# Patient Record
Sex: Female | Born: 1955 | Race: Black or African American | Hispanic: No | Marital: Single | Smoking: Former smoker
Health system: Southern US, Community
[De-identification: ages and names within clinical notes are randomized; demographics above are authoritative.]

## PROBLEM LIST (undated history)

## (undated) DIAGNOSIS — E119 Type 2 diabetes mellitus without complications: Secondary | ICD-10-CM

## (undated) DIAGNOSIS — I10 Essential (primary) hypertension: Secondary | ICD-10-CM

## (undated) DIAGNOSIS — U071 COVID-19: Secondary | ICD-10-CM

## (undated) HISTORY — PX: TONSILLECTOMY: SUR1361

## (undated) HISTORY — PX: ABDOMINAL HYSTERECTOMY: SHX81

---

## 2005-12-17 ENCOUNTER — Ambulatory Visit: Payer: Self-pay | Admitting: Oncology

## 2005-12-23 LAB — MORPHOLOGY - CHCC SATELLITE
PLT EST ~~LOC~~: ADEQUATE
Platelet Morphology: NORMAL

## 2005-12-23 LAB — CBC WITH DIFFERENTIAL (CANCER CENTER ONLY)
BASO#: 0 10*3/uL (ref 0.0–0.2)
Eosinophils Absolute: 0.1 10*3/uL (ref 0.0–0.5)
HGB: 9.5 g/dL — ABNORMAL LOW (ref 11.6–15.9)
LYMPH#: 2.9 10*3/uL (ref 0.9–3.3)
MCH: 18.8 pg — ABNORMAL LOW (ref 26.0–34.0)
MONO#: 0.3 10*3/uL (ref 0.1–0.9)
MONO%: 4.5 % (ref 0.0–13.0)
NEUT#: 3.4 10*3/uL (ref 1.5–6.5)
RBC: 5.05 10*6/uL (ref 3.70–5.32)

## 2005-12-24 LAB — COMPREHENSIVE METABOLIC PANEL
ALT: 14 U/L (ref 0–40)
AST: 21 U/L (ref 0–37)
Alkaline Phosphatase: 133 U/L — ABNORMAL HIGH (ref 39–117)
BUN: 8 mg/dL (ref 6–23)
Calcium: 9.5 mg/dL (ref 8.4–10.5)
Chloride: 113 mEq/L — ABNORMAL HIGH (ref 96–112)
Creatinine, Ser: 0.8 mg/dL (ref 0.40–1.20)
Total Bilirubin: 0.7 mg/dL (ref 0.3–1.2)

## 2005-12-24 LAB — IRON AND TIBC
%SAT: 12 % — ABNORMAL LOW (ref 20–55)
TIBC: 424 ug/dL (ref 250–470)
UIBC: 373 ug/dL

## 2005-12-24 LAB — RETICULOCYTES (CHCC): ABS Retic: 87.9 10*3/uL (ref 19.0–186.0)

## 2005-12-24 LAB — FOLATE: Folate: 10.1 ng/mL

## 2006-01-28 ENCOUNTER — Ambulatory Visit: Payer: Self-pay | Admitting: Internal Medicine

## 2006-01-29 ENCOUNTER — Ambulatory Visit: Payer: Self-pay | Admitting: Oncology

## 2006-03-02 LAB — CBC WITH DIFFERENTIAL (CANCER CENTER ONLY)
BASO#: 0 10*3/uL (ref 0.0–0.2)
EOS%: 1.1 % (ref 0.0–7.0)
HGB: 10.7 g/dL — ABNORMAL LOW (ref 11.6–15.9)
LYMPH#: 3.3 10*3/uL (ref 0.9–3.3)
MCHC: 31 g/dL — ABNORMAL LOW (ref 32.0–36.0)
NEUT#: 4.3 10*3/uL (ref 1.5–6.5)
RBC: 5.4 10*6/uL — ABNORMAL HIGH (ref 3.70–5.32)

## 2006-03-02 LAB — IRON AND TIBC: Iron: 90 ug/dL (ref 42–145)

## 2006-04-05 ENCOUNTER — Ambulatory Visit: Payer: Self-pay | Admitting: Oncology

## 2006-04-16 LAB — CBC WITH DIFFERENTIAL (CANCER CENTER ONLY)
BASO#: 0 10*3/uL (ref 0.0–0.2)
Eosinophils Absolute: 0.1 10*3/uL (ref 0.0–0.5)
HCT: 35.3 % (ref 34.8–46.6)
HGB: 11.3 g/dL — ABNORMAL LOW (ref 11.6–15.9)
LYMPH%: 44.9 % (ref 14.0–48.0)
MCH: 20.9 pg — ABNORMAL LOW (ref 26.0–34.0)
MCV: 65 fL — ABNORMAL LOW (ref 81–101)
MONO%: 5.7 % (ref 0.0–13.0)
NEUT%: 47.6 % (ref 39.6–80.0)
RBC: 5.43 10*6/uL — ABNORMAL HIGH (ref 3.70–5.32)

## 2006-04-20 LAB — IRON AND TIBC
%SAT: 34 % (ref 20–55)
TIBC: 351 ug/dL (ref 250–470)

## 2006-04-20 LAB — HEMOGLOBINOPATHY EVALUATION
Hgb A2 Quant: 5 % — ABNORMAL HIGH (ref 2.2–3.2)
Hgb F Quant: 0.4 % (ref 0.0–0.5)

## 2006-06-08 ENCOUNTER — Ambulatory Visit: Payer: Self-pay | Admitting: Oncology

## 2006-07-06 ENCOUNTER — Ambulatory Visit: Payer: Self-pay | Admitting: Internal Medicine

## 2006-07-08 LAB — CBC WITH DIFFERENTIAL (CANCER CENTER ONLY)
BASO%: 0.6 % (ref 0.0–2.0)
Eosinophils Absolute: 0.1 10*3/uL (ref 0.0–0.5)
HCT: 33.3 % — ABNORMAL LOW (ref 34.8–46.6)
LYMPH%: 49.3 % — ABNORMAL HIGH (ref 14.0–48.0)
MCH: 20.3 pg — ABNORMAL LOW (ref 26.0–34.0)
MCV: 64 fL — ABNORMAL LOW (ref 81–101)
MONO%: 3.6 % (ref 0.0–13.0)
NEUT%: 45.2 % (ref 39.6–80.0)
Platelets: 310 10*3/uL (ref 145–400)
RDW: 14.4 % (ref 10.5–14.6)

## 2006-07-08 LAB — IRON AND TIBC
Iron: 91 ug/dL (ref 42–145)
UIBC: 233 ug/dL

## 2006-07-08 LAB — FERRITIN: Ferritin: 146 ng/mL (ref 10–291)

## 2006-09-29 ENCOUNTER — Ambulatory Visit: Payer: Self-pay | Admitting: Oncology

## 2006-09-30 LAB — FERRITIN: Ferritin: 174 ng/mL (ref 10–291)

## 2006-09-30 LAB — CBC WITH DIFFERENTIAL (CANCER CENTER ONLY)
BASO%: 0.3 % (ref 0.0–2.0)
EOS%: 1.3 % (ref 0.0–7.0)
HCT: 30.5 % — ABNORMAL LOW (ref 34.8–46.6)
LYMPH#: 2.8 10*3/uL (ref 0.9–3.3)
MCHC: 31.7 g/dL — ABNORMAL LOW (ref 32.0–36.0)
MONO#: 0.3 10*3/uL (ref 0.1–0.9)
NEUT#: 3.2 10*3/uL (ref 1.5–6.5)
NEUT%: 50.6 % (ref 39.6–80.0)
Platelets: 275 10*3/uL (ref 145–400)
RDW: 14.4 % (ref 10.5–14.6)
WBC: 6.4 10*3/uL (ref 3.9–10.0)

## 2006-09-30 LAB — IRON AND TIBC: %SAT: 25 % (ref 20–55)

## 2006-10-05 LAB — SPEP & IFE WITH QIG
Beta 2: 5.1 % (ref 3.2–6.5)
Gamma Globulin: 15.9 % (ref 11.1–18.8)
IgA: 292 mg/dL (ref 68–378)
IgM, Serum: 171 mg/dL (ref 60–263)

## 2006-12-01 ENCOUNTER — Ambulatory Visit: Payer: Self-pay | Admitting: Oncology

## 2006-12-02 LAB — CBC WITH DIFFERENTIAL (CANCER CENTER ONLY)
BASO%: 0.5 % (ref 0.0–2.0)
EOS%: 1 % (ref 0.0–7.0)
LYMPH%: 45.5 % (ref 14.0–48.0)
MCHC: 31.7 g/dL — ABNORMAL LOW (ref 32.0–36.0)
MCV: 63 fL — ABNORMAL LOW (ref 81–101)
MONO#: 0.4 10*3/uL (ref 0.1–0.9)
Platelets: 276 10*3/uL (ref 145–400)
RDW: 15.3 % — ABNORMAL HIGH (ref 10.5–14.6)
WBC: 8.5 10*3/uL (ref 3.9–10.0)

## 2006-12-06 LAB — IRON AND TIBC
%SAT: 25 % (ref 20–55)
TIBC: 328 ug/dL (ref 250–470)

## 2006-12-06 LAB — HEMOGLOBINOPATHY EVALUATION
Hemoglobin Other: 0 % (ref 0.0–0.0)
Hgb A: 94.3 % — ABNORMAL LOW (ref 96.8–97.8)
Hgb S Quant: 0 % (ref 0.0–0.0)

## 2007-06-02 ENCOUNTER — Ambulatory Visit: Payer: Self-pay | Admitting: Obstetrics and Gynecology

## 2007-06-10 ENCOUNTER — Inpatient Hospital Stay: Payer: Self-pay | Admitting: Obstetrics and Gynecology

## 2007-12-25 ENCOUNTER — Emergency Department: Payer: Self-pay | Admitting: Emergency Medicine

## 2008-02-09 ENCOUNTER — Ambulatory Visit: Payer: Self-pay | Admitting: Internal Medicine

## 2009-06-03 ENCOUNTER — Emergency Department: Payer: Self-pay | Admitting: Emergency Medicine

## 2013-08-06 ENCOUNTER — Emergency Department: Payer: Self-pay | Admitting: Emergency Medicine

## 2013-08-10 ENCOUNTER — Emergency Department: Payer: Self-pay | Admitting: Emergency Medicine

## 2016-01-31 ENCOUNTER — Encounter: Payer: Self-pay | Admitting: Emergency Medicine

## 2016-01-31 ENCOUNTER — Emergency Department: Payer: Self-pay

## 2016-01-31 ENCOUNTER — Inpatient Hospital Stay
Admission: EM | Admit: 2016-01-31 | Discharge: 2016-02-04 | DRG: 419 | Disposition: A | Discharge status: Home / Self Care | Payer: Self-pay | Attending: General Surgery | Admitting: General Surgery

## 2016-01-31 DIAGNOSIS — Z90710 Acquired absence of both cervix and uterus: Secondary | ICD-10-CM

## 2016-01-31 DIAGNOSIS — K810 Acute cholecystitis: Principal | ICD-10-CM | POA: Diagnosis present

## 2016-01-31 DIAGNOSIS — I10 Essential (primary) hypertension: Secondary | ICD-10-CM | POA: Diagnosis present

## 2016-01-31 HISTORY — DX: Essential (primary) hypertension: I10

## 2016-01-31 LAB — CBC
HCT: 34.9 % — ABNORMAL LOW (ref 35.0–47.0)
Hemoglobin: 10.9 g/dL — ABNORMAL LOW (ref 12.0–16.0)
MCH: 19.8 pg — ABNORMAL LOW (ref 26.0–34.0)
MCHC: 31.3 g/dL — ABNORMAL LOW (ref 32.0–36.0)
MCV: 63.3 fL — AB (ref 80.0–100.0)
PLATELETS: 259 10*3/uL (ref 150–440)
RBC: 5.52 MIL/uL — AB (ref 3.80–5.20)
RDW: 15.5 % — ABNORMAL HIGH (ref 11.5–14.5)
WBC: 13.5 10*3/uL — AB (ref 3.6–11.0)

## 2016-01-31 LAB — COMPREHENSIVE METABOLIC PANEL
ALT: 14 U/L (ref 14–54)
AST: 26 U/L (ref 15–41)
Albumin: 4.7 g/dL (ref 3.5–5.0)
Alkaline Phosphatase: 102 U/L (ref 38–126)
Anion gap: 10 (ref 5–15)
BUN: 11 mg/dL (ref 6–20)
CHLORIDE: 103 mmol/L (ref 101–111)
CO2: 24 mmol/L (ref 22–32)
CREATININE: 0.74 mg/dL (ref 0.44–1.00)
Calcium: 9.5 mg/dL (ref 8.9–10.3)
Glucose, Bld: 162 mg/dL — ABNORMAL HIGH (ref 65–99)
POTASSIUM: 3.3 mmol/L — AB (ref 3.5–5.1)
Sodium: 137 mmol/L (ref 135–145)
Total Bilirubin: 1.7 mg/dL — ABNORMAL HIGH (ref 0.3–1.2)
Total Protein: 8.6 g/dL — ABNORMAL HIGH (ref 6.5–8.1)

## 2016-01-31 LAB — TROPONIN I: Troponin I: <0.03 ng/mL (ref <0.03)

## 2016-01-31 LAB — LIPASE, BLOOD: Lipase: 14 U/L (ref 11–51)

## 2016-01-31 MED ORDER — ONDANSETRON HCL 4 MG/2ML IJ SOLN
4.0000 mg | Freq: Once | INTRAMUSCULAR | Status: AC
  Administered 2016-01-31: 4 mg via INTRAVENOUS
  Filled 2016-01-31: qty 2

## 2016-01-31 MED ORDER — FENTANYL CITRATE (PF) 100 MCG/2ML IJ SOLN
INTRAMUSCULAR | Status: AC
  Administered 2016-02-01: 75 ug via INTRAVENOUS
  Filled 2016-01-31: qty 2

## 2016-01-31 MED ORDER — FENTANYL CITRATE (PF) 100 MCG/2ML IJ SOLN
75.0000 ug | INTRAMUSCULAR | Status: AC
  Administered 2016-02-01: 75 ug via INTRAVENOUS

## 2016-01-31 MED ORDER — FENTANYL CITRATE (PF) 100 MCG/2ML IJ SOLN
75.0000 ug | Freq: Once | INTRAMUSCULAR | Status: AC
  Administered 2016-01-31: 75 ug via INTRAVENOUS
  Filled 2016-01-31: qty 2

## 2016-01-31 MED ORDER — IOPAMIDOL (ISOVUE-300) INJECTION 61%
30.0000 mL | Freq: Once | INTRAVENOUS | Status: AC
  Administered 2016-01-31: 30 mL via ORAL

## 2016-01-31 MED ORDER — IOPAMIDOL (ISOVUE-300) INJECTION 61%
100.0000 mL | Freq: Once | INTRAVENOUS | Status: AC | PRN
  Administered 2016-01-31: 100 mL via INTRAVENOUS

## 2016-01-31 MED ORDER — FENTANYL CITRATE (PF) 100 MCG/2ML IJ SOLN
75.0000 ug | INTRAMUSCULAR | Status: AC
  Administered 2016-01-31: 75 ug via INTRAVENOUS
  Filled 2016-01-31: qty 2

## 2016-01-31 NOTE — ED Provider Notes (Signed)
[facility] Emergency Department Provider Note  ____________________________________________   None    (approximate)  I have reviewed the triage vital signs and the nursing notes.   HISTORY  Chief Complaint Abdominal Pain   HPI Olivia Love is a 61 y.o. female history hypertension  Patient reports since this morning she did having right-sided abdominal pain, severe in nature associated with nausea but no vomiting. Feeling chilled at times but no fever. Denies chest pain or shortness of breath  Has not really anything due to pain. Previous history of hysterectomy   Past Medical History:  Diagnosis Date  . Hypertension     There are no active problems to display for this patient.   Past Surgical History:  Procedure Laterality Date  . ABDOMINAL HYSTERECTOMY    . CESAREAN SECTION    . TONSILLECTOMY      Prior to Admission medications   Not on File    Allergies Morphine and related  No family history on file.  Social History Social History  Substance Use Topics  . Smoking status: Never Smoker  . Smokeless tobacco: Never Used  . Alcohol use No    Review of Systems Constitutional: No feverBut feels chills Eyes: No visual changes. ENT: No sore throat. Cardiovascular: Denies chest pain. Respiratory: Denies shortness of breath. Gastrointestinal:  No diarrhea.  No constipation. Genitourinary: Negative for dysuria. Musculoskeletal: Negative for back pain. Skin: Negative for rash. Neurological: Negative for headaches, focal weakness or numbness.  10-point ROS otherwise negative.  ____________________________________________   PHYSICAL EXAM:  VITAL SIGNS: ED Triage Vitals  Enc Vitals Group     BP 01/31/16 2024 (!) 158/76     Pulse Rate 01/31/16 2024 85     Resp 01/31/16 2024 18     Temp 01/31/16 2024 99.2 F (37.3 C)     Temp Source 01/31/16 2024 Oral     SpO2 01/31/16 2024 99 %     Weight 01/31/16 2025 225 lb (102.1  kg)     Height 01/31/16 2025 5\' 4"  (1.626 m)     Head Circumference --      Peak Flow --      Pain Score 01/31/16 2025 10     Pain Loc --      Pain Edu? --      Excl. in [place]? --     Constitutional: Alert and oriented. Well appearing and in no acute distressExcept she does appear in moderate pain holding the right upper abdomen. Eyes: Conjunctivae are normal. PERRL. EOMI. Head: Atraumatic. Nose: No congestion/rhinnorhea. Mouth/Throat: Mucous membranes are moist.  Oropharynx non-erythematous. Neck: No stridor.   Cardiovascular: Normal rate, regular rhythm. Grossly normal heart sounds.  Good peripheral circulation. Respiratory: Normal respiratory effort.  No retractions. Lungs CTAB. Gastrointestinal: Soft and markedly tender with evidence of mild rebound involving the right upper quadrant and right mid abdomen. No evidence of pain on the left to palpation. No distention. No abdominal bruits. No CVA tenderness. Musculoskeletal: No lower extremity tenderness nor edema.  No joint effusions. Neurologic:  Normal speech and language. No gross focal neurologic deficits are appreciated.  Skin:  Skin is warm, dry and intact. No rash noted. Psychiatric: Mood and affect are normal. Speech and behavior are normal.  ____________________________________________   LABS (all labs ordered are listed, but only abnormal results are displayed)  Labs Reviewed  COMPREHENSIVE METABOLIC PANEL - Abnormal; Notable for the following:       Result Value   Potassium 3.3 (*)  Glucose, Bld 162 (*)    Total Protein 8.6 (*)    Total Bilirubin 1.7 (*)    All other components within normal limits  CBC - Abnormal; Notable for the following:    WBC 13.5 (*)    RBC 5.52 (*)    Hemoglobin 10.9 (*)    HCT 34.9 (*)    MCV 63.3 (*)    MCH 19.8 (*)    MCHC 31.3 (*)    RDW 15.5 (*)    All other components within normal limits  LIPASE, BLOOD  TROPONIN I  URINALYSIS COMPLETEWITH MICROSCOPIC ([facility] ONLY)    ____________________________________________  EKG  ED ECG REPORT I, Weronika Birch, the attending physician, personally viewed and interpreted this ECG.  Date: 01/31/2016 EKG Time: 2040 Rate: 80 Rhythm: normal sinus rhythm QRS Axis: normal Intervals: normal ST/T Wave abnormalities: normal Conduction Disturbances: none Narrative Interpretation: unremarkable  ____________________________________________  RADIOLOGY  Ct Abdomen Pelvis W Contrast  Result Date: 01/31/2016 CLINICAL DATA:  Acute onset of right lower quadrant abdominal pain, radiating to the right side of the back. Initial encounter. EXAM: CT ABDOMEN AND PELVIS WITH CONTRAST TECHNIQUE: Multidetector CT imaging of the abdomen and pelvis was performed using the standard protocol following bolus administration of intravenous contrast. CONTRAST:  178mL ISOVUE-300 IOPAMIDOL (ISOVUE-300) INJECTION 61% COMPARISON:  MRI of the lumbar spine performed 07/06/2006 FINDINGS: Lower chest: Mild bibasilar atelectasis is noted. The visualized portions of the mediastinum are unremarkable. Contrast within the distal esophagus may reflect gastroesophageal reflux or esophageal dysmotility. Hepatobiliary: The liver is unremarkable in appearance. Gallbladder wall thickening is noted, with trace pericholecystic fluid, raising concern for acute cholecystitis. Small stones are noted dependently within the gallbladder. The common bile duct remains normal in caliber. Pancreas: The pancreas is within normal limits. Spleen: The spleen is unremarkable in appearance. Adrenals/Urinary Tract: The adrenal glands are unremarkable in appearance. The kidneys are within normal limits. There is no evidence of hydronephrosis. No renal or ureteral stones are identified. No perinephric stranding is seen. Stomach/Bowel: The stomach is unremarkable in appearance. The small bowel is within normal limits. The appendix is normal in caliber, without evidence of appendicitis. The  colon is unremarkable in appearance. Vascular/Lymphatic: Minimal calcification is seen along the abdominal aorta. No retroperitoneal or pelvic sidewall lymphadenopathy is seen. Reproductive: The bladder is mildly distended and grossly unremarkable. The patient is status post hysterectomy. No suspicious adnexal masses are seen. Other: No additional soft tissue abnormalities are seen. Musculoskeletal: No acute osseous abnormalities are identified. The visualized musculature is unremarkable in appearance. IMPRESSION: 1. Gallbladder wall thickening, with trace pericholecystic fluid, raising concern for acute cholecystitis. Underlying cholelithiasis noted. 2. Mild bibasilar atelectasis noted. 3. Contrast within the distal esophagus may reflect gastroesophageal reflux or esophageal dysmotility. Electronically Signed   By: Garald Balding M.D.   On: 01/31/2016 23:10    ____________________________________________   PROCEDURES  Procedure(s) performed: None  Procedures  Critical Care performed: No  ____________________________________________   INITIAL IMPRESSION / ASSESSMENT AND PLAN / ED COURSE  Pertinent labs & imaging results that were available during my care of the patient were reviewed by me and considered in my medical decision making (see chart for details).  Differential diagnosis includes but is not limited to, abdominal perforation, aortic dissection, cholecystitis, appendicitis, diverticulitis, colitis, esophagitis/gastritis, kidney stone, pyelonephritis, urinary tract infection, aortic aneurysm. All are considered in decision and treatment plan. Based upon the patient's presentation and risk factors, I am most concerned for possible acute cholecystitis, right-sided diverticulitis or other  acute intra-abdominal process involving the right side. We will proceed with CT, pain control and close further evaluation  ----------------------------------------- 11:32 PM on  01/31/2016 -----------------------------------------  CT concerning for acute cholecystitis. Patient reports pain improved. Discussed with Dr. Adonis Huguenin, will see the patient in consult. Anticipate admission, ongoing care including follow-up and surgical consult recommendations assigned to Dr. Owens Shark   Clinical Course     ____________________________________________   FINAL CLINICAL IMPRESSION(S) / ED DIAGNOSES  Final diagnoses:  Acute cholecystitis      NEW MEDICATIONS STARTED DURING THIS VISIT:  New Prescriptions   No medications on file     Note:  This document was prepared using Dragon voice recognition software and may include unintentional dictation errors.     Delman Kitten, MD 01/31/16 [phone]

## 2016-01-31 NOTE — ED Notes (Signed)
Patient transported to CT 

## 2016-01-31 NOTE — ED Triage Notes (Signed)
Pt states that her abdominal pain started yesterday in the morning. Pt is experiencing N/V and diaphoresis. Pt states that she ate some food two days and threw up and denies N/V yesterday but is experiencing N/V again today.

## 2016-01-31 NOTE — ED Notes (Signed)
Pt has RLQ pain radiating to lower right back. Pain began this morning along with n/v. Pt denying any changes in BM . Pt is moaning from pain at this time. Pt is tender to RLQ with palpitation.

## 2016-01-31 NOTE — ED Notes (Signed)
Dr. Jacqualine Code is aware that pt was unable to drink the second bottle of CT contrast. Arnel from CT notified that Dr. Jacqualine Code would like pt to go ahead for scan.

## 2016-01-31 NOTE — ED Notes (Signed)
Pt okayed nurse to speak with son Olivia Love about her condition.

## 2016-02-01 DIAGNOSIS — K810 Acute cholecystitis: Principal | ICD-10-CM

## 2016-02-01 LAB — URINALYSIS COMPLETE WITH MICROSCOPIC (ARMC ONLY)
BACTERIA UA: NONE SEEN
BILIRUBIN URINE: NEGATIVE
Glucose, UA: 50 mg/dL — AB
HGB URINE DIPSTICK: NEGATIVE
LEUKOCYTES UA: NEGATIVE
Nitrite: NEGATIVE
PH: 6 (ref 5.0–8.0)
Protein, ur: NEGATIVE mg/dL
Specific Gravity, Urine: 1.057 — ABNORMAL HIGH (ref 1.005–1.030)

## 2016-02-01 LAB — COMPREHENSIVE METABOLIC PANEL
ALBUMIN: 4 g/dL (ref 3.5–5.0)
ALK PHOS: 94 U/L (ref 38–126)
ALT: 15 U/L (ref 14–54)
ANION GAP: 8 (ref 5–15)
AST: 23 U/L (ref 15–41)
BILIRUBIN TOTAL: 0.9 mg/dL (ref 0.3–1.2)
BUN: 10 mg/dL (ref 6–20)
CALCIUM: 9.1 mg/dL (ref 8.9–10.3)
CO2: 25 mmol/L (ref 22–32)
Chloride: 103 mmol/L (ref 101–111)
Creatinine, Ser: 0.69 mg/dL (ref 0.44–1.00)
Glucose, Bld: 191 mg/dL — ABNORMAL HIGH (ref 65–99)
POTASSIUM: 2.9 mmol/L — AB (ref 3.5–5.1)
Sodium: 136 mmol/L (ref 135–145)
TOTAL PROTEIN: 7.7 g/dL (ref 6.5–8.1)

## 2016-02-01 LAB — CBC
HEMATOCRIT: 31.6 % — AB (ref 35.0–47.0)
Hemoglobin: 10.3 g/dL — ABNORMAL LOW (ref 12.0–16.0)
MCH: 20.1 pg — AB (ref 26.0–34.0)
MCHC: 32.5 g/dL (ref 32.0–36.0)
MCV: 62 fL — ABNORMAL LOW (ref 80.0–100.0)
Platelets: 218 10*3/uL (ref 150–440)
RBC: 5.1 MIL/uL (ref 3.80–5.20)
RDW: 15.4 % — AB (ref 11.5–14.5)
WBC: 15.3 10*3/uL — ABNORMAL HIGH (ref 3.6–11.0)

## 2016-02-01 LAB — MAGNESIUM: MAGNESIUM: 1.9 mg/dL (ref 1.7–2.4)

## 2016-02-01 LAB — POTASSIUM: POTASSIUM: 3.4 mmol/L — AB (ref 3.5–5.1)

## 2016-02-01 MED ORDER — KETOROLAC TROMETHAMINE 30 MG/ML IJ SOLN
30.0000 mg | Freq: Four times a day (QID) | INTRAMUSCULAR | Status: DC | PRN
  Administered 2016-02-01 – 2016-02-02 (×4): 30 mg via INTRAVENOUS
  Filled 2016-02-01 (×5): qty 1

## 2016-02-01 MED ORDER — POTASSIUM CHLORIDE 20 MEQ PO PACK
40.0000 meq | PACK | Freq: Three times a day (TID) | ORAL | Status: DC
  Administered 2016-02-01: 40 meq via ORAL

## 2016-02-01 MED ORDER — HYDRALAZINE HCL 20 MG/ML IJ SOLN: 10.0000 mg | INTRAMUSCULAR | Status: DC | PRN

## 2016-02-01 MED ORDER — DIPHENHYDRAMINE HCL 12.5 MG/5ML PO ELIX: 12.5000 mg | ORAL_SOLUTION | Freq: Four times a day (QID) | ORAL | Status: DC | PRN

## 2016-02-01 MED ORDER — ONDANSETRON 4 MG PO TBDP: 4.0000 mg | ORAL_TABLET | Freq: Four times a day (QID) | ORAL | Status: DC | PRN

## 2016-02-01 MED ORDER — SODIUM CHLORIDE 0.9 % IV SOLN
Freq: Once | INTRAVENOUS | Status: AC
  Administered 2016-02-01: 19:00:00 via INTRAVENOUS
  Filled 2016-02-01: qty 1000

## 2016-02-01 MED ORDER — SODIUM CHLORIDE 0.9 % IV SOLN
30.0000 meq | Freq: Once | INTRAVENOUS | Status: AC
  Administered 2016-02-01: 30 meq via INTRAVENOUS
  Filled 2016-02-01: qty 15

## 2016-02-01 MED ORDER — PIPERACILLIN-TAZOBACTAM 3.375 G IVPB: 3.3750 g | Freq: Three times a day (TID) | INTRAVENOUS | Status: DC

## 2016-02-01 MED ORDER — POTASSIUM CHLORIDE 20 MEQ PO PACK
40.0000 meq | PACK | Freq: Three times a day (TID) | ORAL | Status: DC
  Filled 2016-02-01: qty 2

## 2016-02-01 MED ORDER — FENTANYL CITRATE (PF) 100 MCG/2ML IJ SOLN: 25.0000 ug | INTRAMUSCULAR | Status: DC | PRN

## 2016-02-01 MED ORDER — ONDANSETRON HCL 4 MG/2ML IJ SOLN
4.0000 mg | Freq: Four times a day (QID) | INTRAMUSCULAR | Status: DC | PRN
  Administered 2016-02-01 – 2016-02-02 (×3): 4 mg via INTRAVENOUS
  Filled 2016-02-01 (×2): qty 2

## 2016-02-01 MED ORDER — HYDROMORPHONE HCL 1 MG/ML IJ SOLN
1.0000 mg | INTRAMUSCULAR | Status: DC | PRN
  Administered 2016-02-01 – 2016-02-03 (×7): 1 mg via INTRAVENOUS
  Filled 2016-02-01 (×7): qty 1

## 2016-02-01 MED ORDER — PIPERACILLIN-TAZOBACTAM 3.375 G IVPB
3.3750 g | Freq: Three times a day (TID) | INTRAVENOUS | Status: DC
  Administered 2016-02-01 – 2016-02-04 (×10): 3.375 g via INTRAVENOUS
  Filled 2016-02-01 (×10): qty 50

## 2016-02-01 MED ORDER — ACETAMINOPHEN 10 MG/ML IV SOLN
1000.0000 mg | Freq: Four times a day (QID) | INTRAVENOUS | Status: AC | PRN
  Administered 2016-02-01 – 2016-02-02 (×2): 1000 mg via INTRAVENOUS
  Filled 2016-02-01 (×3): qty 100

## 2016-02-01 MED ORDER — KCL IN DEXTROSE-NACL 20-5-0.45 MEQ/L-%-% IV SOLN
INTRAVENOUS | Status: DC
  Administered 2016-02-01 (×2): via INTRAVENOUS
  Filled 2016-02-01 (×5): qty 1000

## 2016-02-01 MED ORDER — DIPHENHYDRAMINE HCL 50 MG/ML IJ SOLN
12.5000 mg | Freq: Four times a day (QID) | INTRAMUSCULAR | Status: DC | PRN
  Administered 2016-02-01: 12.5 mg via INTRAVENOUS
  Filled 2016-02-01: qty 1

## 2016-02-01 NOTE — Progress Notes (Signed)
She continues to have discomfort but has had no fever and her abdominal exams improved. She has agreed to consider surgical intervention. We will plan surgery for tomorrow morning. She is in agreement.

## 2016-02-01 NOTE — Progress Notes (Signed)
I reviewed her admission workup and discussed situation with her in detail. She remains mildly hypokalemic which is currently being replaced. She still has marked abdominal discomfort. I talked about the possibility of surgery. She does not want to consider surgery currently. I outlined the possibility of further complications with her continued symptoms but present time she does not want to consider any surgical intervention.

## 2016-02-01 NOTE — Progress Notes (Signed)
CONSULT NOTE - INITIAL   Pharmacy Consult for Electrolyte Monitoring and Management   Allergies  Allergen Reactions  . Morphine And Related Itching    Patient Measurements: Height: 5\' 4"  (162.6 cm) Weight: 223 lb 12.8 oz (101.5 kg) IBW/kg (Calculated) : 54.7   Vital Signs: Temp: 97.5 F (36.4 C) (11/04 1712) Temp Source: Oral (11/04 1712) BP: 135/77 (11/04 1712) Pulse Rate: 106 (11/04 1712)  Labs:  Lab Results  Component Value Date   NA 136 02/01/2016   K 2.9 (L) 02/01/2016   CL 103 02/01/2016   CO2 25 02/01/2016   Estimated Creatinine Clearance: 84.5 mL/min (by C-G formula based on SCr of 0.69 mg/dL).   Microbiology: No results found for this or any previous visit (from the past 720 hour(s)).  Medical History: Past Medical History:  Diagnosis Date  . Hypertension     Assessment: 60 yo female with hypokalemia and  planned surgery in AM.   K+:2.9 Mg:1.9  Goal of Therapy:  K: 3.5-5 mmol/L Mag: 1.7 - 2.4 mg/dL  Plan:  Magnesium currently wnl. Patients is currently receiving KCL 80 mEq in 107ml with KCL 40 mEq PO TID. Will F/U with K+ level 1 hours after infusion is complete.   Pernell Dupre, PharmD Clinical Pharmacist  02/01/2016,6:03 PM

## 2016-02-01 NOTE — ED Notes (Signed)
Pt transported to room 203. 

## 2016-02-01 NOTE — Clinical Social Work Note (Signed)
CSW received consult for financial resources. CSW met patient at bedside and provided resource list within [facility]. The patient thanked the CSW. CSW signing off; however, patient was advised to alert CSW if she has further questions.  Santiago Bumpers, MSW, LCSW-A [phone]

## 2016-02-01 NOTE — Progress Notes (Signed)
Dr. Kerry Kass notified about patient  HR; Will continue to monitor  vital signs and control pain; patient remains  NPO.

## 2016-02-01 NOTE — Progress Notes (Signed)
Dr Adonis Huguenin aware of low potassium, orders to be placed.

## 2016-02-01 NOTE — Progress Notes (Signed)
MD notified about increase in HR and increase in temperature; MD to put in order for meds for Increase in temp and will continue to monitor HR; no distress observed at this time.

## 2016-02-01 NOTE — H&P (Signed)
Patient ID: Olivia Love, female   DOB: 09/17/1955, 60 y.o.   MRN: [id number]  CC: Abdominal pain  HPI Olivia Love is a 60 y.o. female who presents to the emergency department with an approximately four-day history of abdominal pain. Patient stated that the pain started after eating in her right upper quadrant all she was in [place]. It persisted and gradually worsened. She did not want to seek medical care while she was traveling and she took the train back to [place] today before coming to the emergency department. She's had multiple bouts of nausea and vomiting during the pain. The pain is mostly colicky but has maintained in her right upper quadrant. She's had pains like this before but never this bad she says. Previously he would always go away within an hour or 2 after eating but this has persisted. She's had subjective fevers and chills but not taken her temperature. She denies any chest pain, shortness of breath, diarrhea, constipation. She is otherwise in her usual state of health.  HPI  Past Medical History:  Diagnosis Date  . Hypertension     Past Surgical History:  Procedure Laterality Date  . ABDOMINAL HYSTERECTOMY    . CESAREAN SECTION    . TONSILLECTOMY      Family history: Patient is unaware of any family history of diabetes, cancers, heart disease.  Social History Social History  Substance Use Topics  . Smoking status: Never Smoker  . Smokeless tobacco: Never Used  . Alcohol use No    Allergies  Allergen Reactions  . Morphine And Related Itching    No current facility-administered medications for this encounter.    No current outpatient prescriptions on file.     Review of Systems A Multi-point review of systems was asked and was negative except for the findings documented in the history of present illness  Physical Exam Blood pressure 122/62, pulse 79, temperature 99.2 F (37.3 C), temperature source Oral, resp. rate 18, height 5\' 4"  (1.626 m),  weight 102.1 kg (225 lb), SpO2 96 %. CONSTITUTIONAL: No acute distress. EYES: Pupils are equal, round, and reactive to light, Sclera are non-icteric. EARS, NOSE, MOUTH AND THROAT: The oropharynx is clear. The oral mucosa is pink and moist. Hearing is intact to voice. LYMPH NODES:  Lymph nodes in the neck are normal. RESPIRATORY:  Lungs are clear. There is normal respiratory effort, with equal breath sounds bilaterally, and without pathologic use of accessory muscles. CARDIOVASCULAR: Heart is regular without murmurs, gallops, or rubs. GI: The abdomen is large, soft, tender to palpation in the right upper quadrant with a positive Murphy sign, and nondistended. There are no palpable masses. There is no hepatosplenomegaly. There are normal bowel sounds in all quadrants. GU: Rectal deferred.   MUSCULOSKELETAL: Normal muscle strength and tone. No cyanosis or edema.   SKIN: Turgor is good and there are no pathologic skin lesions or ulcers. NEUROLOGIC: Motor and sensation is grossly normal. Cranial nerves are grossly intact. PSYCH:  Oriented to person, place and time. Affect is normal.  Data Reviewed Images and labs reviewed. Labs concerning for a leukocytosis of 13.5, a hyperbilirubinemia of 1.7, a hypokalemia of 3.3. CT scan reviewed which appears to show a dilated, thickened gallbladder with some pericholecystic fluid as well as some gallstones. This would be consistent with acute cholecystitis. I have personally reviewed the patient's imaging, laboratory findings and medical records.    Assessment    Acute cholecystitis    Plan    60 year old  female with acute cholecystitis. Discussed the diagnosis at length with the patient and her family members are present in the room. Counseled her that the treatment for this is IV antibiotics and then usually surgical removal of the gallbladder. Patient seemed reticent to agree to any surgery. However she is willing to accept admission to the hospital, IV  fluids, IV antibiotics and as needed medications. Counseled her that should her pain not completely resolve that a cholecystectomy would be indicated. She voiced understanding of this but continued to state that she only wants surgery if she actually has to have it.     Time spent with the patient was 50 minutes, with more than 50% of the time spent in face-to-face education, counseling and care coordination.     Clayburn Pert, MD FACS General Surgeon 02/01/2016, 12:22 AM

## 2016-02-02 ENCOUNTER — Inpatient Hospital Stay: Payer: Self-pay | Admitting: Anesthesiology

## 2016-02-02 ENCOUNTER — Encounter
Admission: EM | Disposition: A | Discharge status: Home / Self Care | Payer: Self-pay | Source: Home / Self Care | Attending: General Surgery

## 2016-02-02 ENCOUNTER — Encounter: Payer: Self-pay | Admitting: Anesthesiology

## 2016-02-02 ENCOUNTER — Inpatient Hospital Stay: Payer: Self-pay

## 2016-02-02 HISTORY — PX: CHOLECYSTECTOMY: SHX55

## 2016-02-02 LAB — HEPATIC FUNCTION PANEL
ALK PHOS: 130 U/L — AB (ref 38–126)
ALT: 83 U/L — AB (ref 14–54)
AST: 89 U/L — ABNORMAL HIGH (ref 15–41)
Albumin: 3 g/dL — ABNORMAL LOW (ref 3.5–5.0)
BILIRUBIN DIRECT: 2 mg/dL — AB (ref 0.1–0.5)
BILIRUBIN INDIRECT: 1.7 mg/dL — AB (ref 0.3–0.9)
BILIRUBIN TOTAL: 3.7 mg/dL — AB (ref 0.3–1.2)
Total Protein: 6.4 g/dL — ABNORMAL LOW (ref 6.5–8.1)

## 2016-02-02 LAB — BASIC METABOLIC PANEL
Anion gap: 7 (ref 5–15)
BUN: 18 mg/dL (ref 6–20)
CHLORIDE: 108 mmol/L (ref 101–111)
CO2: 22 mmol/L (ref 22–32)
CREATININE: 1.16 mg/dL — AB (ref 0.44–1.00)
Calcium: 8.8 mg/dL — ABNORMAL LOW (ref 8.9–10.3)
GFR calc Af Amer: 57 mL/min — ABNORMAL LOW (ref >60)
GFR calc non Af Amer: 49 mL/min — ABNORMAL LOW (ref >60)
GLUCOSE: 121 mg/dL — AB (ref 65–99)
POTASSIUM: 4.5 mmol/L (ref 3.5–5.1)
Sodium: 137 mmol/L (ref 135–145)

## 2016-02-02 SURGERY — LAPAROSCOPIC CHOLECYSTECTOMY WITH INTRAOPERATIVE CHOLANGIOGRAM
Anesthesia: General

## 2016-02-02 MED ORDER — LIDOCAINE HCL (CARDIAC) 20 MG/ML IV SOLN
INTRAVENOUS | Status: DC | PRN
  Administered 2016-02-02: 50 mg via INTRAVENOUS

## 2016-02-02 MED ORDER — SUCCINYLCHOLINE CHLORIDE 20 MG/ML IJ SOLN
INTRAMUSCULAR | Status: DC | PRN
  Administered 2016-02-02: 140 mg via INTRAVENOUS

## 2016-02-02 MED ORDER — GLYCOPYRROLATE 0.2 MG/ML IJ SOLN
INTRAMUSCULAR | Status: DC | PRN
  Administered 2016-02-02: 0.4 mg via INTRAVENOUS

## 2016-02-02 MED ORDER — PROPOFOL 10 MG/ML IV BOLUS
INTRAVENOUS | Status: DC | PRN
  Administered 2016-02-02: 150 mg via INTRAVENOUS

## 2016-02-02 MED ORDER — FENTANYL CITRATE (PF) 100 MCG/2ML IJ SOLN
25.0000 ug | INTRAMUSCULAR | Status: DC | PRN
  Administered 2016-02-02 (×2): 25 ug via INTRAVENOUS

## 2016-02-02 MED ORDER — KCL IN DEXTROSE-NACL 20-5-0.2 MEQ/L-%-% IV SOLN
INTRAVENOUS | Status: DC
  Administered 2016-02-02 – 2016-02-04 (×6): via INTRAVENOUS
  Filled 2016-02-02 (×10): qty 1000

## 2016-02-02 MED ORDER — PHENYLEPHRINE HCL 10 MG/ML IJ SOLN
INTRAMUSCULAR | Status: DC | PRN
  Administered 2016-02-02 (×3): 25 ug via INTRAVENOUS
  Administered 2016-02-02: 50 ug via INTRAVENOUS

## 2016-02-02 MED ORDER — HEPARIN SODIUM (PORCINE) 5000 UNIT/ML IJ SOLN
INTRAMUSCULAR | Status: AC
  Filled 2016-02-02: qty 1

## 2016-02-02 MED ORDER — MIDAZOLAM HCL 2 MG/2ML IJ SOLN
INTRAMUSCULAR | Status: DC | PRN
  Administered 2016-02-02: 2 mg via INTRAVENOUS

## 2016-02-02 MED ORDER — NEOSTIGMINE METHYLSULFATE 10 MG/10ML IV SOLN
INTRAVENOUS | Status: DC | PRN
  Administered 2016-02-02: 2 mg via INTRAVENOUS

## 2016-02-02 MED ORDER — FENTANYL CITRATE (PF) 100 MCG/2ML IJ SOLN
INTRAMUSCULAR | Status: AC
  Administered 2016-02-02: 25 ug via INTRAVENOUS
  Filled 2016-02-02: qty 2

## 2016-02-02 MED ORDER — ONDANSETRON HCL 4 MG/2ML IJ SOLN: 4.0000 mg | Freq: Once | INTRAMUSCULAR | Status: DC | PRN

## 2016-02-02 MED ORDER — SUCCINYLCHOLINE CHLORIDE 20 MG/ML IJ SOLN: INTRAMUSCULAR | Status: DC | PRN

## 2016-02-02 MED ORDER — BUPIVACAINE HCL (PF) 0.25 % IJ SOLN
INTRAMUSCULAR | Status: DC | PRN
  Administered 2016-02-02: 30 mL

## 2016-02-02 MED ORDER — LACTATED RINGERS IV SOLN
INTRAVENOUS | Status: DC | PRN
  Administered 2016-02-02 (×3): via INTRAVENOUS

## 2016-02-02 MED ORDER — FENTANYL CITRATE (PF) 100 MCG/2ML IJ SOLN
INTRAMUSCULAR | Status: DC | PRN
  Administered 2016-02-02: 50 ug via INTRAVENOUS
  Administered 2016-02-02 (×2): 25 ug via INTRAVENOUS
  Administered 2016-02-02 (×4): 50 ug via INTRAVENOUS

## 2016-02-02 MED ORDER — BUPIVACAINE HCL (PF) 0.25 % IJ SOLN
INTRAMUSCULAR | Status: AC
  Filled 2016-02-02: qty 30

## 2016-02-02 MED ORDER — ROCURONIUM BROMIDE 100 MG/10ML IV SOLN
INTRAVENOUS | Status: DC | PRN
  Administered 2016-02-02 (×2): 5 mg via INTRAVENOUS

## 2016-02-02 MED ORDER — DEXAMETHASONE SODIUM PHOSPHATE 10 MG/ML IJ SOLN
INTRAMUSCULAR | Status: DC | PRN
  Administered 2016-02-02: 5 mg via INTRAVENOUS

## 2016-02-02 SURGICAL SUPPLY — 53 items
ANCHOR TIS RET SYS 235ML (MISCELLANEOUS) ×2 IMPLANT
APPLIER CLIP ROT 10 11.4 M/L (STAPLE) ×3
APR CLP MED LRG 11.4X10 (STAPLE) ×1
BAG COUNTER SPONGE EZ (MISCELLANEOUS) ×2 IMPLANT
BAG SPNG 4X4 CLR HAZ (MISCELLANEOUS) ×1
BAG TISS RTRVL C235 10X14 (MISCELLANEOUS) ×1
BULB RESERV EVAC DRAIN JP 100C (MISCELLANEOUS) ×4 IMPLANT
CANISTER SUCT 1200ML W/VALVE (MISCELLANEOUS) ×3 IMPLANT
CANISTER SUCT 3000ML (MISCELLANEOUS) ×4 IMPLANT
CATH REDDICK CHOLANGI 4FR 50CM (CATHETERS) ×3 IMPLANT
CHLORAPREP W/TINT 26ML (MISCELLANEOUS) ×3 IMPLANT
CLIP APPLIE ROT 10 11.4 M/L (STAPLE) ×1 IMPLANT
CONRAY 60ML FOR OR (MISCELLANEOUS) ×3 IMPLANT
COUNTER SPONGE BAG EZ (MISCELLANEOUS) ×1
DRAIN CHANNEL JP 19F (MISCELLANEOUS) ×4 IMPLANT
DRAPE SHEET LG 3/4 BI-LAMINATE (DRAPES) ×3 IMPLANT
DRSG TEGADERM 2-3/8X2-3/4 SM (GAUZE/BANDAGES/DRESSINGS) ×9 IMPLANT
DRSG TELFA 3X8 NADH (GAUZE/BANDAGES/DRESSINGS) ×3 IMPLANT
ELECT REM PT RETURN 9FT ADLT (ELECTROSURGICAL) ×3
ELECTRODE REM PT RTRN 9FT ADLT (ELECTROSURGICAL) ×1 IMPLANT
GLOVE BIO SURGEON STRL SZ7.5 (GLOVE) ×7 IMPLANT
GLOVE INDICATOR 8.0 STRL GRN (GLOVE) ×3 IMPLANT
GOWN STRL REUS W/ TWL LRG LVL3 (GOWN DISPOSABLE) ×2 IMPLANT
GOWN STRL REUS W/TWL LRG LVL3 (GOWN DISPOSABLE) ×6
GRASPER SUT TROCAR 14GX15 (MISCELLANEOUS) ×3 IMPLANT
IRRIGATION STRYKERFLOW (MISCELLANEOUS) ×1 IMPLANT
IRRIGATOR STRYKERFLOW (MISCELLANEOUS) ×3
IV NS 1000ML (IV SOLUTION) ×12
IV NS 1000ML BAXH (IV SOLUTION) ×1 IMPLANT
LABEL OR SOLS (LABEL) ×3 IMPLANT
NDL HYPO 25X1 1.5 SAFETY (NEEDLE) ×1 IMPLANT
NDL INSUFFLATION 14GA 120MM (NEEDLE) ×1 IMPLANT
NDL SAFETY 18GX1.5 (NEEDLE) ×3 IMPLANT
NEEDLE HYPO 25X1 1.5 SAFETY (NEEDLE) ×3 IMPLANT
NEEDLE INSUFFLATION 14GA 120MM (NEEDLE) ×3 IMPLANT
NS IRRIG 500ML POUR BTL (IV SOLUTION) ×3 IMPLANT
PACK LAP CHOLECYSTECTOMY (MISCELLANEOUS) ×3 IMPLANT
PAD DRESSING TELFA 3X8 NADH (GAUZE/BANDAGES/DRESSINGS) ×1 IMPLANT
PENCIL ELECTRO HAND CTR (MISCELLANEOUS) ×3 IMPLANT
POUCH ENDO CATCH 10MM SPEC (MISCELLANEOUS) ×3 IMPLANT
SCISSORS METZENBAUM CVD 33 (INSTRUMENTS) ×3 IMPLANT
SEAL FOR SCOPE WARMER C3101 (MISCELLANEOUS) IMPLANT
SLEEVE ADV FIXATION 5X100MM (TROCAR) ×3 IMPLANT
SPONGE DRAIN TRACH 4X4 STRL 2S (GAUZE/BANDAGES/DRESSINGS) ×2 IMPLANT
STAPLER SKIN PROX 35W (STAPLE) ×2 IMPLANT
SUT ETHILON 5-0 FS-2 18 BLK (SUTURE) ×3 IMPLANT
SUT VIC AB 0 CT2 27 (SUTURE) ×6 IMPLANT
SYR 3ML LL SCALE MARK (SYRINGE) ×3 IMPLANT
TROCAR Z-THREAD FIOS 11X100 BL (TROCAR) ×3 IMPLANT
TROCAR Z-THREAD OPTICAL 5X100M (TROCAR) ×3 IMPLANT
TROCAR Z-THREAD SLEEVE 11X100 (TROCAR) ×3 IMPLANT
TUBING INSUFFLATOR HI FLOW (MISCELLANEOUS) ×3 IMPLANT
WATER STERILE IRR 1000ML POUR (IV SOLUTION) ×3 IMPLANT

## 2016-02-02 NOTE — Anesthesia Postprocedure Evaluation (Signed)
Anesthesia Post Note  Patient: Olivia Love  Procedure(s) Performed: Procedure(s) (LRB): LAPAROSCOPIC CHOLECYSTECTOMY (N/A)  Patient location during evaluation: PACU Anesthesia Type: General Level of consciousness: awake and alert and oriented Pain management: pain level controlled Vital Signs Assessment: post-procedure vital signs reviewed and stable Respiratory status: spontaneous breathing Cardiovascular status: blood pressure returned to baseline Anesthetic complications: no    Last Vitals:  Vitals:   02/02/16 1312 02/02/16 1410  BP: 136/76 138/78  Pulse: 90 (!) 119  Resp: 18 18  Temp: 36.7 C 36.7 C    Last Pain:  Vitals:   02/02/16 1747  TempSrc:   PainSc: 3                  Reggie Bise

## 2016-02-02 NOTE — Op Note (Signed)
01/31/2016 - 02/02/2016  10:55 AM  PATIENT:  Olivia Love  60 y.o. female  PRE-OPERATIVE DIAGNOSIS:  cholecystitis  POST-OPERATIVE DIAGNOSIS:  same  PROCEDURE:  Procedure(s): LAPAROSCOPIC CHOLECYSTECTOMY (N/A)  SURGEON:  Surgeon(s) and Role:    * Dia Crawford III, MD - Primary   ASSISTANTS: none   ANESTHESIA:   general  EBL:  Total I/O In: 2000 [I.V.:2000] Out: 200 [Blood:200]   DRAINS: (2) Jackson-Pratt drain(s) with closed bulb suction in the Subhepatic and subphrenic spaces   LOCAL MEDICATIONS USED:  MARCAINE      DISPOSITION OF SPECIMEN:  PATHOLOGY   DICTATION: .Dragon Dictation with the patient in the supine position and after induction appropriate general anesthesia the patient's abdomen was prepped ChloraPrep and draped sterile towels. The patient was placed headdown feet up position. A small infraumbilical incision was made standard fashion carried down bluntly through subcutaneous tissue. A varies needle was used to cannulate peritoneal cavity but the temp was unsuccessful as appropriate pressures measurements could not be identified. The Optiview cannula was then used in the umbilicus and I still could not enter the abdominal cavity is FINE were adhesions. Made a left upper quadrant transverse incision made in the abdomen was then cannulated using the Optiview cannula. The umbilical area was examined. There is no bowel against the anterior abdominal wall but there were multiple adhesions were previous surgery. 2 lateral ports were placed under direct vision using 5 mm cannula. The adhesions were taken down bluntly around the umbilicus. Attention was then turned to the gallbladder which is primarily necrotic. The severely distended inflamed with gangrenous distal portion. It was aspirated of some old 30 bile which appeared to be obstructed. Gallbladder was grasped retracted superiorly and laterally. Dissection was carried along the hepatoduodenal ligament. Cystic artery  cystic duct were identified. No attempt was made to perform cholangiography as the hepatoduodenal ligament appeared to be frankly inflamed and necrotic. Duct was doubly clipped and divided. Cystic artery was doubly clipped and divided. Gallbladder is dissected free of its bed in the liver is using the cautery apparatus. Multiple small granules of stone material were spilled during this maneuver. Simply manipulating the gallbladder created multiple tears. The gallbladder could not be captured in an Endo Catch apparatus or any of our specimen bags. The umbilical incision was enlarged gallbladder brought out through the umbilicus using the 11 mm grasping instrument. Midline fascia closed using 0 Vicryl in figure-of-eight fashion and the port placed back into the abdominal cavity. There was irrigated with 4 L of warm saline solution. Hancock drains were placed one over the liver 1 under the liver was brought up to the lateral stab wound secured with 3-0 nylon. The ends desufflated. Skin incisions were closed with 3-0 nylon and skin clips. She returned recovery room having tolerated procedure well. Sponge instrument needle count were correct 2 in the operating room.  PLAN OF CARE: Admit to inpatient   PATIENT DISPOSITION:  PACU - hemodynamically stable.   Dia Crawford III, MD

## 2016-02-02 NOTE — Anesthesia Preprocedure Evaluation (Signed)
Anesthesia Evaluation  Patient identified by MRN, date of birth, ID band Patient awake    Reviewed: Allergy & Precautions, NPO status , Patient's Chart, lab work & pertinent test results  Airway Mallampati: II       Dental  (+) Edentulous Upper   Pulmonary neg pulmonary ROS,    Pulmonary exam normal        Cardiovascular hypertension, Pt. on medications Normal cardiovascular exam     Neuro/Psych negative neurological ROS  negative psych ROS   GI/Hepatic Neg liver ROS, cholecystitis   Endo/Other  negative endocrine ROS  Renal/GU negative Renal ROS  negative genitourinary   Musculoskeletal negative musculoskeletal ROS (+)   Abdominal Normal abdominal exam  (+)   Peds negative pediatric ROS (+)  Hematology  (+) anemia ,   Anesthesia Other Findings   Reproductive/Obstetrics                             Anesthesia Physical Anesthesia Plan  ASA: II  Anesthesia Plan: General   Post-op Pain Management:    Induction:   Airway Management Planned: Oral ETT  Additional Equipment:   Intra-op Plan:   Post-operative Plan: Extubation in OR  Informed Consent: I have reviewed the patients History and Physical, chart, labs and discussed the procedure including the risks, benefits and alternatives for the proposed anesthesia with the patient or authorized representative who has indicated his/her understanding and acceptance.   Dental advisory given  Plan Discussed with: CRNA and Surgeon  Anesthesia Plan Comments:         Anesthesia Quick Evaluation

## 2016-02-02 NOTE — Progress Notes (Signed)
CONSULT NOTE - INITIAL   Pharmacy Consult for Electrolyte Monitoring and Management   Allergies  Allergen Reactions  . Morphine And Related Itching    Patient Measurements: Height: 5\' 4"  (162.6 cm) Weight: 223 lb 12.8 oz (101.5 kg) IBW/kg (Calculated) : 54.7   Vital Signs: Temp: 98.4 F (36.9 C) (11/05 0026) Temp Source: Oral (11/05 0026) BP: 109/53 (11/05 0026) Pulse Rate: 107 (11/05 0026)  Labs:  Lab Results  Component Value Date   NA 137 02/02/2016   K 4.5 02/02/2016   CL 108 02/02/2016   CO2 22 02/02/2016   Estimated Creatinine Clearance: 58.3 mL/min (by C-G formula based on SCr of 1.16 mg/dL (H)).   Microbiology: No results found for this or any previous visit (from the past 720 hour(s)).  Medical History: Past Medical History:  Diagnosis Date  . Hypertension     Assessment: 60 yo female with hypokalemia and  planned surgery in AM.   K+:2.9 Mg:1.9  Goal of Therapy:  K: 3.5-5 mmol/L Mag: 1.7 - 2.4 mg/dL  Plan:  Magnesium currently wnl. Patients is currently receiving KCL 80 mEq in 1048ml with KCL 40 mEq PO TID. Will F/U with K+ level 1 hours after infusion is complete.   11/5 AM K+ 4.5. Orders for PO repletion d/c. BMP ordered for tomorrow AM.  Eloise Harman, PharmD Clinical Pharmacist  02/02/2016,5:52 AM

## 2016-02-02 NOTE — Progress Notes (Signed)

## 2016-02-02 NOTE — Anesthesia Procedure Notes (Signed)
Procedure Name: Intubation Performed by: Vaughan Sine Pre-anesthesia Checklist: Patient identified, Emergency Drugs available, Suction available, Patient being monitored and Timeout performed Patient Re-evaluated:Patient Re-evaluated prior to inductionOxygen Delivery Method: Circle system utilized Preoxygenation: Pre-oxygenation with 100% oxygen Intubation Type: IV induction, Cricoid Pressure applied and Rapid sequence Ventilation: Mask ventilation without difficulty Laryngoscope Size: Miller and 3 Grade View: Grade II Tube type: Oral Number of attempts: 1 Airway Equipment and Method: Stylet Placement Confirmation: ETT inserted through vocal cords under direct vision,  positive ETCO2,  CO2 detector and breath sounds checked- equal and bilateral Secured at: 21 cm Tube secured with: Tape Dental Injury: Teeth and Oropharynx as per pre-operative assessment

## 2016-02-02 NOTE — Transfer of Care (Signed)
Immediate Anesthesia Transfer of Care Note  Patient: Olivia Love  Procedure(s) Performed: Procedure(s): LAPAROSCOPIC CHOLECYSTECTOMY (N/A)  Patient Location: PACU  Anesthesia Type:General  Level of Consciousness: awake, oriented and sedated  Airway & Oxygen Therapy: Patient Spontanous Breathing and Patient connected to face mask oxygen  Post-op Assessment: Report given to RN and Post -op Vital signs reviewed and stable  Post vital signs: Reviewed and stable  Last Vitals:  Vitals:   02/02/16 0635 02/02/16 1059  BP:    Pulse:    Resp:    Temp: 37.7 C 36.5 C    Last Pain:  Vitals:   02/02/16 0730  TempSrc:   PainSc: 3       Patients Stated Pain Goal: 0 ([id number])  Complications: No apparent anesthesia complications

## 2016-02-03 ENCOUNTER — Encounter: Payer: Self-pay | Admitting: Surgery

## 2016-02-03 LAB — BASIC METABOLIC PANEL
ANION GAP: 5 (ref 5–15)
BUN: 19 mg/dL (ref 6–20)
CALCIUM: 8.6 mg/dL — AB (ref 8.9–10.3)
CO2: 23 mmol/L (ref 22–32)
CREATININE: 0.83 mg/dL (ref 0.44–1.00)
Chloride: 107 mmol/L (ref 101–111)
Glucose, Bld: 121 mg/dL — ABNORMAL HIGH (ref 65–99)
Potassium: 3.9 mmol/L (ref 3.5–5.1)
SODIUM: 135 mmol/L (ref 135–145)

## 2016-02-03 MED ORDER — ACETAMINOPHEN 325 MG PO TABS
650.0000 mg | ORAL_TABLET | Freq: Four times a day (QID) | ORAL | Status: DC
  Administered 2016-02-03 – 2016-02-04 (×6): 650 mg via ORAL
  Filled 2016-02-03 (×6): qty 2

## 2016-02-03 MED ORDER — KETOROLAC TROMETHAMINE 30 MG/ML IJ SOLN
30.0000 mg | Freq: Four times a day (QID) | INTRAMUSCULAR | Status: DC
  Administered 2016-02-03 – 2016-02-04 (×6): 30 mg via INTRAVENOUS
  Filled 2016-02-03 (×6): qty 1

## 2016-02-03 MED ORDER — HYDROMORPHONE HCL 1 MG/ML IJ SOLN: 1.0000 mg | INTRAMUSCULAR | Status: DC | PRN

## 2016-02-03 MED ORDER — OXYCODONE HCL 5 MG PO TABS
10.0000 mg | ORAL_TABLET | ORAL | Status: DC | PRN
  Administered 2016-02-03 – 2016-02-04 (×2): 10 mg via ORAL
  Filled 2016-02-03 (×2): qty 2

## 2016-02-03 NOTE — Progress Notes (Signed)
60 year old woman who had acute gangrenous cholecystitis POD#1 from laparoscopic cholecystectomy, with drain placement 2. Patient states that she's been doing well getting up and moving around. Patient states that the pain is well-controlled. Patient tolerated clear liquids today without any issue and is currently eating regular diet for super tray. She has been passing gas as well.   Vitals:   02/03/16 0549 02/03/16 1225  BP: 131/72 128/74  Pulse: 93 92  Resp: 18 18  Temp: 98.7 F (37.1 C) 98.7 F (37.1 C)   I/O last 3 completed shifts: In: 4701.1 [P.O.:1060; I.V.:3391.1; IV Piggyback:250] Out: [id number] [Urine:950; Drains:85; Blood:200] Total I/O In: 1897 [P.O.:120; I.V.:1744; IV Piggyback:33] Out: 1640 [Urine:1550; Drains:90]   PE:  Gen: NAD Abd: soft, incision c/d/i, JP drains serosangious, non-bilious   CBC Latest Ref Rng & Units 02/01/2016 01/31/2016 12/02/2006  WBC 3.6 - 11.0 K/uL 15.3(H) 13.5(H) 8.5  Hemoglobin 12.0 - 16.0 g/dL 10.3(L) 10.9(L) 10.1(L)  Hematocrit 35.0 - 47.0 % 31.6(L) 34.9(L) 31.7(L)  Platelets 150 - 440 K/uL 218 259 276   CMP Latest Ref Rng & Units 02/03/2016 02/02/2016 02/01/2016  Glucose 65 - 99 mg/dL 121(H) 121(H) -  BUN 6 - 20 mg/dL 19 18 -  Creatinine 0.44 - 1.00 mg/dL 0.83 1.16(H) -  Sodium 135 - 145 mmol/L 135 137 -  Potassium 3.5 - 5.1 mmol/L 3.9 4.5 3.4(L)  Chloride 101 - 111 mmol/L 107 108 -  CO2 22 - 32 mmol/L 23 22 -  Calcium 8.9 - 10.3 mg/dL 8.6(L) 8.8(L) -  Total Protein 6.5 - 8.1 g/dL - 6.4(L) -  Total Bilirubin 0.3 - 1.2 mg/dL - 3.7(H) -  Alkaline Phos 38 - 126 U/L - 130(H) -  AST 15 - 41 U/L - 89(H) -  ALT 14 - 54 U/L - 83(H) -     A/P:  60 year old woman who had acute gangrenous cholecystitis POD#1 from laparoscopic cholecystectomy, with drain placement 2  Pain: tylenol, oxycodone prns GI: continue JP drain, advanced to regular diet today, will get repeat CMP in AM to ensure Bili is trending down from yesterday.  Encourage  ambulation

## 2016-02-03 NOTE — Care Management (Signed)
Patient admitted post lap chole.  Patient lives at home with son.  Self pay patient.  No PCP.  Patient currently on clear liquid diet with 2 JP drains in place. RNCM following for discharge medication needs.  Patient resides in [place].  Will provide application to [facility] and Medication management.

## 2016-02-04 LAB — MAGNESIUM: Magnesium: 2 mg/dL (ref 1.7–2.4)

## 2016-02-04 LAB — COMPREHENSIVE METABOLIC PANEL
ALBUMIN: 2.6 g/dL — AB (ref 3.5–5.0)
ALK PHOS: 92 U/L (ref 38–126)
ALT: 45 U/L (ref 14–54)
ANION GAP: 7 (ref 5–15)
AST: 26 U/L (ref 15–41)
BILIRUBIN TOTAL: 0.6 mg/dL (ref 0.3–1.2)
BUN: 12 mg/dL (ref 6–20)
CALCIUM: 8.3 mg/dL — AB (ref 8.9–10.3)
CO2: 22 mmol/L (ref 22–32)
Chloride: 109 mmol/L (ref 101–111)
Creatinine, Ser: 0.84 mg/dL (ref 0.44–1.00)
GFR calc non Af Amer: >60 mL/min (ref >60)
Glucose, Bld: 113 mg/dL — ABNORMAL HIGH (ref 65–99)
POTASSIUM: 3.6 mmol/L (ref 3.5–5.1)
SODIUM: 138 mmol/L (ref 135–145)
TOTAL PROTEIN: 6.1 g/dL — AB (ref 6.5–8.1)

## 2016-02-04 LAB — PHOSPHORUS: PHOSPHORUS: 2.2 mg/dL — AB (ref 2.5–4.6)

## 2016-02-04 LAB — SURGICAL PATHOLOGY

## 2016-02-04 MED ORDER — POTASSIUM & SODIUM PHOSPHATES 280-160-250 MG PO PACK
1.0000 | PACK | Freq: Three times a day (TID) | ORAL | Status: DC
  Administered 2016-02-04 (×2): 1 via ORAL
  Filled 2016-02-04 (×5): qty 1

## 2016-02-04 MED ORDER — OXYCODONE HCL 10 MG PO TABS: 10.0000 mg | ORAL_TABLET | ORAL | 0 refills | Status: DC | PRN

## 2016-02-04 MED ORDER — POTASSIUM CHLORIDE 20 MEQ/15ML (10%) PO SOLN
20.0000 meq | Freq: Once | ORAL | Status: AC
  Administered 2016-02-04: 20 meq via ORAL
  Filled 2016-02-04: qty 15

## 2016-02-04 MED ORDER — IBUPROFEN 800 MG PO TABS: 800.0000 mg | ORAL_TABLET | Freq: Three times a day (TID) | ORAL | 0 refills | Status: DC | PRN

## 2016-02-04 NOTE — Care Management (Signed)
[facility] clinic application provided.  Coupon for oxycodone from goodrx provided, out of pocket cost $8.02.  RNCM signing off

## 2016-02-04 NOTE — Progress Notes (Signed)
CONSULT NOTE - INITIAL   Pharmacy Consult for Electrolyte Monitoring and Management   Allergies  Allergen Reactions  . Morphine And Related Itching    Patient Measurements: Height: 5\' 4"  (162.6 cm) Weight: 223 lb 12.8 oz (101.5 kg) IBW/kg (Calculated) : 54.7   Vital Signs: Temp: 98 F (36.7 C) (11/07 0448) Temp Source: Oral (11/07 0448) BP: 122/77 (11/07 0448) Pulse Rate: 90 (11/07 0448)  Labs:  Lab Results  Component Value Date   NA 138 02/04/2016   K 3.6 02/04/2016   CL 109 02/04/2016   CO2 22 02/04/2016   Estimated Creatinine Clearance: 80.5 mL/min (by C-G formula based on SCr of 0.84 mg/dL).   Microbiology: No results found for this or any previous visit (from the past 720 hour(s)).  Medical History: Past Medical History:  Diagnosis Date  . Hypertension     Assessment: 60 yo female with hypokalemia and  planned surgery in AM.   K+:2.9 Mg:1.9  Goal of Therapy:  K: 3.5-5 mmol/L Mag: 1.7 - 2.4 mg/dL  Plan:  Magnesium currently wnl. Patients is currently receiving KCL 80 mEq in 1063ml with KCL 40 mEq PO TID. Will F/U with K+ level 1 hours after infusion is complete.   11/5 AM K+ 4.5. Orders for PO repletion d/c. BMP ordered for tomorrow AM.  11/7 0513 K 3.6, Ca 8.3, corrected Ca 9.4, Mg 2, phos 2.2. Ordered Phos-Nak 250 mg po TID with meals x 3 doses and potassium chloride 10% oral solution 20 mEq po once. Will recheck electrolytes with AM labs.  Laural Benes, Pharm.D., BCPS Clinical Pharmacist  02/04/2016,6:28 AM

## 2016-02-04 NOTE — Progress Notes (Signed)
Alert and oriented. Vital signs stable . No signs of acute distress. Discharge instructions given. Teaching about JP drain care given to patient.Patient verbalized understanding and exhibited return demonstration. No other issues noted at this time.

## 2016-02-14 ENCOUNTER — Ambulatory Visit (INDEPENDENT_AMBULATORY_CARE_PROVIDER_SITE_OTHER): Payer: Self-pay | Admitting: Surgery

## 2016-02-14 ENCOUNTER — Encounter: Payer: Self-pay | Admitting: Surgery

## 2016-02-14 VITALS — BP 153/100 | HR 105 | Temp 98.5°F | Wt 220.0 lb

## 2016-02-14 DIAGNOSIS — K810 Acute cholecystitis: Secondary | ICD-10-CM

## 2016-02-14 NOTE — Patient Instructions (Signed)

## 2016-02-14 NOTE — Progress Notes (Signed)
60 year old female who had a laparoscopic cholecystectomy for gangrenous cholecystitis with Dr. Pat Patrick. Patient has been doing well only minimal serosanguineous drainage coming from both of her drains. Patient states pain is been well controlled. Patient states that her appetite is been returning. She denies any fever chills.  Vitals:   02/14/16 1425  BP: (!) 153/100  Pulse: (!) 105  Temp: 98.5 F (36.9 C)   PE:  Gen: NAD Abd: soft, appropriately tender, staples removed from laparoscopic sites, c/d/i, midline incision every other staple removed, both JP drains minimal serous drainage   A/P:  60 year old female who had gangrenous cholecystitis. She had 2 JP drains which were draining minimal serous fluid these were removed today. Every other staple was removed from her midline incision and I will have her return in one week for wound check to see if the remaining staples can be removed.

## 2016-02-15 NOTE — Discharge Summary (Signed)
Patient ID: Olivia Love MRN: [id number] DOB/AGE: 1955/07/14 60 y.o.  Admit date: 01/31/2016 Discharge date: 02/15/2016  Discharge Diagnoses:  Acute gangrenous cholecystitis.  Procedures Performed: Laparoscopic cholecystectomy.  Discharged Condition: good  Hospital Course: She was admitted hospital with signs and symptoms consistent with acute cholecystitis. Imaging studies, laboratory values, clinical presentation suggests that diagnosis. After appropriate preoperative preparation informed consent she was taken to surgery for laparoscopic cholecystectomy. She was noted to have a severely gangrenous gallbladder surgery was quite difficult. She had a proper recovery postoperatively. Her drain was removed prior to discharge. After appropriate discharge goals were met, she was discharged home prefall the office in 7-10 days time. Bathing activity drive instructions were given the patient.  Discharge Orders: Discharge Instructions    Call MD for:  difficulty breathing, headache or visual disturbances    Complete by:  As directed    Call MD for:  persistant nausea and vomiting    Complete by:  As directed    Call MD for:  redness, tenderness, or signs of infection (pain, swelling, redness, odor or green/yellow discharge around incision site)    Complete by:  As directed    Call MD for:  severe uncontrolled pain    Complete by:  As directed    Call MD for:  temperature >100.4    Complete by:  As directed    Diet - low sodium heart healthy    Complete by:  As directed    Driving Restrictions    Complete by:  As directed    No driving while on prescription pain medication   Increase activity slowly    Complete by:  As directed    Lifting restrictions    Complete by:  As directed    No lifting over 15lbs for 2 weeks   May shower / Bathe    Complete by:  As directed    No dressing needed    Complete by:  As directed       Disposition: 01-Home or Self Care  Discharge  Medications: No current facility-administered medications for this encounter.   Current Outpatient Prescriptions:  .  Biotin (BIOTIN MAXIMUM STRENGTH) 10 MG TABS, Take 10 mg by mouth daily., Disp: , Rfl:  .  cholecalciferol (VITAMIN D) 1000 units tablet, Take 1,000 Units by mouth daily., Disp: , Rfl:  .  ferrous sulfate 325 (65 FE) MG tablet, Take 325 mg by mouth daily with breakfast., Disp: , Rfl:  .  hydrochlorothiazide (MICROZIDE) 12.5 MG capsule, Take 12.5 mg by mouth daily., Disp: , Rfl:  .  omega-3 acid ethyl esters (LOVAZA) 1 g capsule, Take 1 g by mouth daily., Disp: , Rfl:  .  ibuprofen (ADVIL,MOTRIN) 800 MG tablet, Take 1 tablet (800 mg total) by mouth every 8 (eight) hours as needed., Disp: 30 tablet, Rfl: 0 .  oxyCODONE 10 MG TABS, Take 1 tablet (10 mg total) by mouth every 3 (three) hours as needed for severe pain or breakthrough pain., Disp: 30 tablet, Rfl: 0  Follwup: Follow-up Information    [facility]. Go on 02/14/2016.   Why:  at 2:30pm in the [facility] office with Dr. Azalee Course. Contact information: [address] [place]          Signed: Dia Crawford III 02/15/2016, 10:12 AM

## 2016-02-19 ENCOUNTER — Encounter: Payer: Self-pay | Admitting: Surgery

## 2016-02-19 ENCOUNTER — Ambulatory Visit (INDEPENDENT_AMBULATORY_CARE_PROVIDER_SITE_OTHER): Payer: Self-pay | Admitting: Surgery

## 2016-02-19 VITALS — BP 145/86 | HR 82 | Temp 98.4°F | Ht 64.0 in | Wt 219.0 lb

## 2016-02-19 DIAGNOSIS — Z09 Encounter for follow-up examination after completed treatment for conditions other than malignant neoplasm: Secondary | ICD-10-CM

## 2016-02-19 NOTE — Progress Notes (Signed)
S/p lap chole by Dr. Pat Patrick Path d/w pt She is doing well Taking PO, afebrile  PE NAD Abd: soft, NT, incisions healing well, no infection, staples removed  A/P Doing well No heavy lifting RTC prn No complications

## 2016-02-19 NOTE — Patient Instructions (Signed)
PLease call our office if you have questions or concerns. 

## 2017-12-01 ENCOUNTER — Ambulatory Visit: Payer: Self-pay | Attending: Oncology

## 2018-12-20 ENCOUNTER — Other Ambulatory Visit: Payer: Self-pay | Admitting: Physician Assistant

## 2018-12-20 DIAGNOSIS — Z0000 Encounter for general adult medical examination without abnormal findings: Secondary | ICD-10-CM

## 2018-12-21 ENCOUNTER — Encounter: Payer: Self-pay | Admitting: Physician Assistant

## 2019-01-04 ENCOUNTER — Encounter: Payer: Self-pay | Admitting: *Deleted

## 2019-01-04 ENCOUNTER — Other Ambulatory Visit: Payer: Self-pay | Admitting: Physician Assistant

## 2019-01-04 DIAGNOSIS — Z1231 Encounter for screening mammogram for malignant neoplasm of breast: Secondary | ICD-10-CM

## 2019-01-18 ENCOUNTER — Encounter (INDEPENDENT_AMBULATORY_CARE_PROVIDER_SITE_OTHER): Payer: Self-pay

## 2019-01-18 ENCOUNTER — Ambulatory Visit
Admission: RE | Admit: 2019-01-18 | Discharge: 2019-01-18 | Disposition: A | Discharge status: Home / Self Care | Payer: Medicare Other | Source: Ambulatory Visit | Attending: Physician Assistant | Admitting: Physician Assistant

## 2019-01-18 ENCOUNTER — Other Ambulatory Visit: Payer: Self-pay

## 2019-01-18 DIAGNOSIS — Z1231 Encounter for screening mammogram for malignant neoplasm of breast: Secondary | ICD-10-CM

## 2019-08-11 ENCOUNTER — Other Ambulatory Visit: Payer: Self-pay | Admitting: Physician Assistant

## 2019-08-11 DIAGNOSIS — Z13820 Encounter for screening for osteoporosis: Secondary | ICD-10-CM

## 2019-08-24 ENCOUNTER — Telehealth: Payer: Medicare Other

## 2019-10-29 ENCOUNTER — Other Ambulatory Visit: Payer: Self-pay

## 2019-10-29 ENCOUNTER — Emergency Department: Payer: Medicare Other

## 2019-10-29 ENCOUNTER — Emergency Department
Admission: EM | Admit: 2019-10-29 | Discharge: 2019-10-29 | Disposition: A | Discharge status: Home / Self Care | Payer: Medicare Other | Attending: Emergency Medicine | Admitting: Emergency Medicine

## 2019-10-29 ENCOUNTER — Encounter: Payer: Self-pay | Admitting: Emergency Medicine

## 2019-10-29 DIAGNOSIS — I10 Essential (primary) hypertension: Secondary | ICD-10-CM | POA: Insufficient documentation

## 2019-10-29 DIAGNOSIS — Z79899 Other long term (current) drug therapy: Secondary | ICD-10-CM | POA: Insufficient documentation

## 2019-10-29 DIAGNOSIS — U071 COVID-19: Secondary | ICD-10-CM | POA: Diagnosis not present

## 2019-10-29 DIAGNOSIS — R079 Chest pain, unspecified: Secondary | ICD-10-CM | POA: Diagnosis present

## 2019-10-29 LAB — CBC WITH DIFFERENTIAL/PLATELET
Abs Immature Granulocytes: 0.01 10*3/uL (ref 0.00–0.07)
Basophils Absolute: 0 10*3/uL (ref 0.0–0.1)
Basophils Relative: 0 %
Eosinophils Absolute: 0 10*3/uL (ref 0.0–0.5)
Eosinophils Relative: 0 %
HCT: 36.7 % (ref 36.0–46.0)
Hemoglobin: 11.3 g/dL — ABNORMAL LOW (ref 12.0–15.0)
Immature Granulocytes: 0 %
Lymphocytes Relative: 33 %
Lymphs Abs: 1.6 10*3/uL (ref 0.7–4.0)
MCH: 20 pg — ABNORMAL LOW (ref 26.0–34.0)
MCHC: 30.8 g/dL (ref 30.0–36.0)
MCV: 65.1 fL — ABNORMAL LOW (ref 80.0–100.0)
Monocytes Absolute: 0.3 10*3/uL (ref 0.1–1.0)
Monocytes Relative: 6 %
Neutro Abs: 2.9 10*3/uL (ref 1.7–7.7)
Neutrophils Relative %: 61 %
Platelets: 242 10*3/uL (ref 150–400)
RBC: 5.64 MIL/uL — ABNORMAL HIGH (ref 3.87–5.11)
RDW: 16.4 % — ABNORMAL HIGH (ref 11.5–15.5)
Smear Review: NORMAL
WBC: 4.8 10*3/uL (ref 4.0–10.5)
nRBC: 0.4 % — ABNORMAL HIGH (ref 0.0–0.2)

## 2019-10-29 LAB — LACTIC ACID, PLASMA: Lactic Acid, Venous: 1.2 mmol/L (ref 0.5–1.9)

## 2019-10-29 LAB — RESP PANEL BY RT PCR (RSV, FLU A&B, COVID)
Influenza A by PCR: NEGATIVE
Influenza B by PCR: NEGATIVE
Respiratory Syncytial Virus by PCR: NEGATIVE
SARS Coronavirus 2 by RT PCR: POSITIVE — AB

## 2019-10-29 LAB — COMPREHENSIVE METABOLIC PANEL
ALT: 35 U/L (ref 0–44)
AST: 36 U/L (ref 15–41)
Albumin: 4.4 g/dL (ref 3.5–5.0)
Alkaline Phosphatase: 94 U/L (ref 38–126)
Anion gap: 9 (ref 5–15)
BUN: 9 mg/dL (ref 8–23)
CO2: 24 mmol/L (ref 22–32)
Calcium: 9.1 mg/dL (ref 8.9–10.3)
Chloride: 105 mmol/L (ref 98–111)
Creatinine, Ser: 0.86 mg/dL (ref 0.44–1.00)
GFR calc Af Amer: >60 mL/min (ref >60)
GFR calc non Af Amer: >60 mL/min (ref >60)
Glucose, Bld: 177 mg/dL — ABNORMAL HIGH (ref 70–99)
Potassium: 3.4 mmol/L — ABNORMAL LOW (ref 3.5–5.1)
Sodium: 138 mmol/L (ref 135–145)
Total Bilirubin: 1 mg/dL (ref 0.3–1.2)
Total Protein: 7.7 g/dL (ref 6.5–8.1)

## 2019-10-29 LAB — TROPONIN I (HIGH SENSITIVITY)
Troponin I (High Sensitivity): 3 ng/L (ref <18)
Troponin I (High Sensitivity): 3 ng/L (ref <18)

## 2019-10-29 MED ORDER — DEXAMETHASONE SODIUM PHOSPHATE 10 MG/ML IJ SOLN
10.0000 mg | Freq: Once | INTRAMUSCULAR | Status: AC
  Administered 2019-10-29: 10 mg via INTRAMUSCULAR
  Filled 2019-10-29: qty 1

## 2019-10-29 MED ORDER — ACETAMINOPHEN 325 MG PO TABS
650.0000 mg | ORAL_TABLET | Freq: Once | ORAL | Status: AC | PRN
  Administered 2019-10-29: 650 mg via ORAL
  Filled 2019-10-29: qty 2

## 2019-10-29 MED ORDER — DEXAMETHASONE SODIUM PHOSPHATE 10 MG/ML IJ SOLN: 10.0000 mg | Freq: Once | INTRAMUSCULAR | Status: DC

## 2019-10-29 NOTE — ED Notes (Signed)
Pt presents to the ED for CP, SOB, productive cough, fatigue, and fever since Wednesday. Pt states the highest fever she saw ar home was 101.7. Pt states that she takes Tylenol every day with no relief. Pt also states she is SOB and a cough with clear mucus to come up. Pt states, "I just want to sleep." On arrival, pt is A&Ox4 and NAD.

## 2019-10-29 NOTE — ED Notes (Signed)
Date and time results received: 10/29/19 2051  Test: COVID Critical Value: Positive   Name of Provider Notified: Vladimir Crofts, NP

## 2019-10-29 NOTE — ED Triage Notes (Addendum)
Here for chest pain starting today.  Pain is intermittent and central but pt reports SHOB is worse than pain.  Mildly labored after exertion but unlabored when sitting. Febrile in triage and reports temps 101-102 since Wednesday. Has not had covid test. + cough and mild loss taste/smell per pt.

## 2019-10-29 NOTE — Discharge Instructions (Signed)
Your COVID-19 test is positive.  You will need to quarantine for 10 days after onset of symptoms plus any remaining symptomatic days.  You were given steroid medication today that should help with your shortness of breath.  In addition to your daily medications, you should also begin taking supplements such as zinc 500, vitamin D, vitamin C.  Please follow-up with your primary care provider if you are not improving over the next few days.  If you begin to experience increase in shortness of breath or you have other symptoms of concern and are unable to speak with your primary care provider please return to the emergency department.

## 2019-10-29 NOTE — ED Provider Notes (Signed)
[facility] Emergency Department Provider Note  ____________________________________________  Time seen: Approximately 8:54 PM  I have reviewed the triage vital signs and the nursing notes.   HISTORY  Chief Complaint Chest Pain   HPI Olivia Love is a 64 y.o. female with a history of hypertension who presents to the emergency department for treatment and evaluation of shortness of breath, cough, and fever that started today. Symptoms are worse with exertion. No known exposure to COVID.    Past Medical History:  Diagnosis Date   Hypertension     There are no problems to display for this patient.   Past Surgical History:  Procedure Laterality Date   ABDOMINAL HYSTERECTOMY     CESAREAN SECTION     CHOLECYSTECTOMY N/A 02/02/2016   Procedure: LAPAROSCOPIC CHOLECYSTECTOMY;  Surgeon: Dia Crawford III, MD;  Location: [facility] ORS;  Service: General;  Laterality: N/A;   TONSILLECTOMY      Prior to Admission medications   Medication Sig Start Date End Date Taking? Authorizing Provider  Biotin (BIOTIN MAXIMUM STRENGTH) 10 MG TABS Take 10 mg by mouth daily.    [provider]  cholecalciferol (VITAMIN D) 1000 units tablet Take 1,000 Units by mouth daily.    [provider]  ferrous sulfate 325 (65 FE) MG tablet Take 325 mg by mouth daily with breakfast.    [provider]  hydrochlorothiazide (MICROZIDE) 12.5 MG capsule Take 12.5 mg by mouth daily.    [provider]  ibuprofen (ADVIL,MOTRIN) 800 MG tablet Take 1 tablet (800 mg total) by mouth every 8 (eight) hours as needed. 02/04/16   Loflin, Lavona Mound, MD  omega-3 acid ethyl esters (LOVAZA) 1 g capsule Take 1 g by mouth daily.    [provider]  oxyCODONE 10 MG TABS Take 1 tablet (10 mg total) by mouth every 3 (three) hours as needed for severe pain or breakthrough pain. 02/04/16   Hubbard Robinson, MD    Allergies Morphine and related  Family History   Problem Relation Age of Onset   Breast cancer Cousin        mat cousin    Social History Social History   Tobacco Use   Smoking status: Never Smoker   Smokeless tobacco: Never Used  Substance Use Topics   Alcohol use: No   Drug use: No    Review of Systems Constitutional: positive for fever/chills. decreased appetite. ENT: No sore throat. Cardiovascular: Intermittant chest pain with cough. Respiratory: Positive for shortness of breath. Positive for cough. Negative for wheezing.  Gastrointestinal: No nausea,  no vomiting.  no diarrhea.  Musculoskeletal: Positive for body aches Skin: Negative for rash. Neurological: Positive for headaches ____________________________________________   PHYSICAL EXAM:  VITAL SIGNS: ED Triage Vitals  Enc Vitals Group     BP 10/29/19 1434 (!) 118/86     Pulse Rate 10/29/19 1434 (!) 125     Resp 10/29/19 1434 20     Temp 10/29/19 1434 (!) 100.9 F (38.3 C)     Temp Source 10/29/19 1434 Oral     SpO2 10/29/19 1434 97 %     Weight 10/29/19 1428 (!) 241 lb (109.3 kg)     Height 10/29/19 1427 5\' 4"  (1.626 m)     Head Circumference --      Peak Flow --      Pain Score 10/29/19 1427 4     Pain Loc --      Pain Edu? --  Excl. in [place]? --     Constitutional: Alert and oriented. Acutely ill appearing and in no acute distress. Eyes: Conjunctivae are normal. Ears: TM normal Nose: No sinus congestion noted; No rhinnorhea. Mouth/Throat: Mucous membranes are moist.  Oropharynx mildly erythematous. Tonsils not visualized. Uvula midline. Neck: No stridor.  Lymphatic: No cervical lymphadenopathy. Cardiovascular: Normal rate, regular rhythm. Good peripheral circulation. Respiratory: Respirations are even and unlabored.  No retractions. Breath sounds clear. Gastrointestinal: Soft and nontender.  Musculoskeletal: FROM x 4 extremities.  Neurologic:  Normal speech and language. Skin:  Skin is warm, dry and intact. No rash  noted. Psychiatric: Mood and affect are normal. Speech and behavior are normal.  ____________________________________________   LABS (all labs ordered are listed, but only abnormal results are displayed)  Labs Reviewed  RESP PANEL BY RT PCR (RSV, FLU A&B, COVID) - Abnormal; Notable for the following components:      Result Value   SARS Coronavirus 2 by RT PCR POSITIVE (*)    All other components within normal limits  CBC WITH DIFFERENTIAL/PLATELET - Abnormal; Notable for the following components:   RBC 5.64 (*)    Hemoglobin 11.3 (*)    MCV 65.1 (*)    MCH 20.0 (*)    RDW 16.4 (*)    nRBC 0.4 (*)    All other components within normal limits  COMPREHENSIVE METABOLIC PANEL - Abnormal; Notable for the following components:   Potassium 3.4 (*)    Glucose, Bld 177 (*)    All other components within normal limits  LACTIC ACID, PLASMA  TROPONIN I (HIGH SENSITIVITY)  TROPONIN I (HIGH SENSITIVITY)   ____________________________________________  EKG  Rate 128; sinus tachycardia, normal PR, normal QRS, no ST elevation or depression. ____________________________________________  RADIOLOGY  No cardiopulmonary abnormality. ____________________________________________   PROCEDURES  Procedure(s) performed: None  Critical Care performed: No ____________________________________________   INITIAL IMPRESSION / ASSESSMENT AND PLAN / ED COURSE  64 y.o. female presents to the emergency department for shortness of breath, cough, and fever. See HPI for further details. Plan will be to get labs including serial troponins, chest x-ray, and COVID swab.    Lactic acid is normal. Troponin not elevated. COVID is positive. Decadron given. No pneumonia or evidence of infection on chest x-ray.   Will discharge home with symptomatic treatment recommendations and advise to follow up with her PCP for concerns. She was advised to return to the ER for symptoms that change or worsen if unable to see  PCP.  Medications  acetaminophen (TYLENOL) tablet 650 mg (650 mg Oral Given 10/29/19 1440)  dexamethasone (DECADRON) injection 10 mg (10 mg Intramuscular Given 10/29/19 2034)    ED Discharge Orders    None       Pertinent labs & imaging results that were available during my care of the patient were reviewed by me and considered in my medical decision making (see chart for details).    If controlled substance prescribed during this visit, 12 month history viewed on the [facility] prior to issuing an initial prescription for Schedule II or III opiod. ____________________________________________   FINAL CLINICAL IMPRESSION(S) / ED DIAGNOSES  Final diagnoses:  COVID-19    Note:  This document was prepared using Dragon voice recognition software and may include unintentional dictation errors.    Victorino Dike, FNP 11/04/19 1226    Nance Pear, MD 11/04/19 1500

## 2019-10-30 ENCOUNTER — Telehealth: Payer: Self-pay | Admitting: Adult Health

## 2019-10-30 NOTE — Telephone Encounter (Signed)
Called and LMOM regarding monoclonal antibody treatment for COVID 19 given to those who are at risk for complications and/or hospitalization of the virus.  Patient meets criteria based on: age and BMI greater than 25  Call back number given: [phone]  My chart message: unable to send  Wilber Bihari, NP

## 2019-11-06 ENCOUNTER — Other Ambulatory Visit: Payer: Self-pay

## 2019-11-06 ENCOUNTER — Inpatient Hospital Stay
Admission: EM | Admit: 2019-11-06 | Discharge: 2019-11-10 | DRG: 177 | Disposition: A | Discharge status: Home / Self Care | Payer: Medicare Other | Attending: Internal Medicine | Admitting: Internal Medicine

## 2019-11-06 ENCOUNTER — Emergency Department: Payer: Medicare Other

## 2019-11-06 DIAGNOSIS — U071 COVID-19: Secondary | ICD-10-CM | POA: Diagnosis present

## 2019-11-06 DIAGNOSIS — Y92239 Unspecified place in hospital as the place of occurrence of the external cause: Secondary | ICD-10-CM | POA: Diagnosis not present

## 2019-11-06 DIAGNOSIS — J9601 Acute respiratory failure with hypoxia: Secondary | ICD-10-CM | POA: Diagnosis present

## 2019-11-06 DIAGNOSIS — Z885 Allergy status to narcotic agent status: Secondary | ICD-10-CM

## 2019-11-06 DIAGNOSIS — J1282 Pneumonia due to coronavirus disease 2019: Secondary | ICD-10-CM | POA: Diagnosis present

## 2019-11-06 DIAGNOSIS — I10 Essential (primary) hypertension: Secondary | ICD-10-CM

## 2019-11-06 DIAGNOSIS — Z6841 Body Mass Index (BMI) 40.0 and over, adult: Secondary | ICD-10-CM

## 2019-11-06 DIAGNOSIS — Z79899 Other long term (current) drug therapy: Secondary | ICD-10-CM

## 2019-11-06 DIAGNOSIS — T380X5A Adverse effect of glucocorticoids and synthetic analogues, initial encounter: Secondary | ICD-10-CM | POA: Diagnosis not present

## 2019-11-06 DIAGNOSIS — Z90710 Acquired absence of both cervix and uterus: Secondary | ICD-10-CM

## 2019-11-06 DIAGNOSIS — J9600 Acute respiratory failure, unspecified whether with hypoxia or hypercapnia: Secondary | ICD-10-CM | POA: Diagnosis not present

## 2019-11-06 DIAGNOSIS — Z803 Family history of malignant neoplasm of breast: Secondary | ICD-10-CM

## 2019-11-06 DIAGNOSIS — Z9049 Acquired absence of other specified parts of digestive tract: Secondary | ICD-10-CM | POA: Diagnosis not present

## 2019-11-06 DIAGNOSIS — E876 Hypokalemia: Secondary | ICD-10-CM | POA: Diagnosis present

## 2019-11-06 DIAGNOSIS — E1165 Type 2 diabetes mellitus with hyperglycemia: Secondary | ICD-10-CM | POA: Diagnosis not present

## 2019-11-06 LAB — CBC WITH DIFFERENTIAL/PLATELET
Abs Immature Granulocytes: 0.17 10*3/uL — ABNORMAL HIGH (ref 0.00–0.07)
Basophils Absolute: 0 10*3/uL (ref 0.0–0.1)
Basophils Relative: 0 %
Eosinophils Absolute: 0 10*3/uL (ref 0.0–0.5)
Eosinophils Relative: 0 %
HCT: 32.2 % — ABNORMAL LOW (ref 36.0–46.0)
Hemoglobin: 10.1 g/dL — ABNORMAL LOW (ref 12.0–15.0)
Immature Granulocytes: 2 %
Lymphocytes Relative: 32 %
Lymphs Abs: 2.6 10*3/uL (ref 0.7–4.0)
MCH: 19.8 pg — ABNORMAL LOW (ref 26.0–34.0)
MCHC: 31.4 g/dL (ref 30.0–36.0)
MCV: 63.3 fL — ABNORMAL LOW (ref 80.0–100.0)
Monocytes Absolute: 0.5 10*3/uL (ref 0.1–1.0)
Monocytes Relative: 6 %
Neutro Abs: 4.8 10*3/uL (ref 1.7–7.7)
Neutrophils Relative %: 60 %
Platelets: 338 10*3/uL (ref 150–400)
RBC: 5.09 MIL/uL (ref 3.87–5.11)
RDW: 15.9 % — ABNORMAL HIGH (ref 11.5–15.5)
WBC: 8.1 10*3/uL (ref 4.0–10.5)
nRBC: 0.9 % — ABNORMAL HIGH (ref 0.0–0.2)

## 2019-11-06 LAB — CBC
HCT: 29.1 % — ABNORMAL LOW (ref 36.0–46.0)
Hemoglobin: 9 g/dL — ABNORMAL LOW (ref 12.0–15.0)
MCH: 19.7 pg — ABNORMAL LOW (ref 26.0–34.0)
MCHC: 30.9 g/dL (ref 30.0–36.0)
MCV: 63.7 fL — ABNORMAL LOW (ref 80.0–100.0)
Platelets: 311 10*3/uL (ref 150–400)
RBC: 4.57 MIL/uL (ref 3.87–5.11)
RDW: 15.8 % — ABNORMAL HIGH (ref 11.5–15.5)
WBC: 7.8 10*3/uL (ref 4.0–10.5)
nRBC: 0.5 % — ABNORMAL HIGH (ref 0.0–0.2)

## 2019-11-06 LAB — COMPREHENSIVE METABOLIC PANEL
ALT: 25 U/L (ref 0–44)
AST: 30 U/L (ref 15–41)
Albumin: 3.7 g/dL (ref 3.5–5.0)
Alkaline Phosphatase: 74 U/L (ref 38–126)
Anion gap: 12 (ref 5–15)
BUN: 10 mg/dL (ref 8–23)
CO2: 25 mmol/L (ref 22–32)
Calcium: 8.8 mg/dL — ABNORMAL LOW (ref 8.9–10.3)
Chloride: 101 mmol/L (ref 98–111)
Creatinine, Ser: 0.86 mg/dL (ref 0.44–1.00)
GFR calc Af Amer: >60 mL/min (ref >60)
GFR calc non Af Amer: >60 mL/min (ref >60)
Glucose, Bld: 154 mg/dL — ABNORMAL HIGH (ref 70–99)
Potassium: 2.9 mmol/L — ABNORMAL LOW (ref 3.5–5.1)
Sodium: 138 mmol/L (ref 135–145)
Total Bilirubin: 1.4 mg/dL — ABNORMAL HIGH (ref 0.3–1.2)
Total Protein: 7.8 g/dL (ref 6.5–8.1)

## 2019-11-06 LAB — C-REACTIVE PROTEIN: CRP: 4.3 mg/dL — ABNORMAL HIGH (ref <1.0)

## 2019-11-06 LAB — LACTATE DEHYDROGENASE: LDH: 281 U/L — ABNORMAL HIGH (ref 98–192)

## 2019-11-06 LAB — FIBRIN DERIVATIVES D-DIMER (ARMC ONLY): Fibrin derivatives D-dimer (ARMC): 1963.06 ng/mL (FEU) — ABNORMAL HIGH (ref 0.00–499.00)

## 2019-11-06 LAB — FIBRINOGEN: Fibrinogen: >750 mg/dL — ABNORMAL HIGH (ref 210–475)

## 2019-11-06 LAB — MAGNESIUM: Magnesium: 2.1 mg/dL (ref 1.7–2.4)

## 2019-11-06 LAB — ABO/RH: ABO/RH(D): O POS

## 2019-11-06 LAB — HIV ANTIBODY (ROUTINE TESTING W REFLEX): HIV Screen 4th Generation wRfx: NONREACTIVE

## 2019-11-06 LAB — PROCALCITONIN: Procalcitonin: <0.1 ng/mL

## 2019-11-06 LAB — TRIGLYCERIDES: Triglycerides: 184 mg/dL — ABNORMAL HIGH (ref <150)

## 2019-11-06 LAB — LACTIC ACID, PLASMA: Lactic Acid, Venous: 1.4 mmol/L (ref 0.5–1.9)

## 2019-11-06 LAB — FERRITIN: Ferritin: 282 ng/mL (ref 11–307)

## 2019-11-06 MED ORDER — DEXAMETHASONE SODIUM PHOSPHATE 10 MG/ML IJ SOLN
8.0000 mg | Freq: Once | INTRAMUSCULAR | Status: AC
  Administered 2019-11-06: 8 mg via INTRAVENOUS
  Filled 2019-11-06: qty 1

## 2019-11-06 MED ORDER — SODIUM CHLORIDE 0.9 % IV SOLN
100.0000 mg | Freq: Every day | INTRAVENOUS | Status: AC
  Administered 2019-11-07 – 2019-11-10 (×4): 100 mg via INTRAVENOUS
  Filled 2019-11-06 (×4): qty 20

## 2019-11-06 MED ORDER — ALBUTEROL SULFATE HFA 108 (90 BASE) MCG/ACT IN AERS
2.0000 | INHALATION_SPRAY | Freq: Four times a day (QID) | RESPIRATORY_TRACT | Status: DC
  Administered 2019-11-06 – 2019-11-10 (×15): 2 via RESPIRATORY_TRACT
  Filled 2019-11-06 (×2): qty 6.7

## 2019-11-06 MED ORDER — ONDANSETRON HCL 4 MG/2ML IJ SOLN: 4.0000 mg | Freq: Four times a day (QID) | INTRAMUSCULAR | Status: DC | PRN

## 2019-11-06 MED ORDER — ENOXAPARIN SODIUM 40 MG/0.4ML ~~LOC~~ SOLN
40.0000 mg | Freq: Two times a day (BID) | SUBCUTANEOUS | Status: DC
  Administered 2019-11-06 – 2019-11-10 (×8): 40 mg via SUBCUTANEOUS
  Filled 2019-11-06 (×8): qty 0.4

## 2019-11-06 MED ORDER — ZINC SULFATE 220 (50 ZN) MG PO CAPS
220.0000 mg | ORAL_CAPSULE | Freq: Every day | ORAL | Status: DC
  Administered 2019-11-06 – 2019-11-10 (×5): 220 mg via ORAL
  Filled 2019-11-06 (×5): qty 1

## 2019-11-06 MED ORDER — POTASSIUM CHLORIDE CRYS ER 20 MEQ PO TBCR
40.0000 meq | EXTENDED_RELEASE_TABLET | Freq: Three times a day (TID) | ORAL | Status: AC
  Administered 2019-11-06 (×3): 40 meq via ORAL
  Filled 2019-11-06 (×3): qty 2

## 2019-11-06 MED ORDER — ENOXAPARIN SODIUM 40 MG/0.4ML ~~LOC~~ SOLN: 40.0000 mg | SUBCUTANEOUS | Status: DC

## 2019-11-06 MED ORDER — SODIUM CHLORIDE 0.9 % IV SOLN
200.0000 mg | Freq: Once | INTRAVENOUS | Status: AC
  Administered 2019-11-06: 200 mg via INTRAVENOUS
  Filled 2019-11-06: qty 200

## 2019-11-06 MED ORDER — DEXAMETHASONE SODIUM PHOSPHATE 10 MG/ML IJ SOLN
6.0000 mg | INTRAMUSCULAR | Status: DC
  Administered 2019-11-06 – 2019-11-10 (×4): 6 mg via INTRAVENOUS
  Filled 2019-11-06 (×4): qty 1

## 2019-11-06 MED ORDER — ONDANSETRON HCL 4 MG PO TABS: 4.0000 mg | ORAL_TABLET | Freq: Four times a day (QID) | ORAL | Status: DC | PRN

## 2019-11-06 MED ORDER — ACETAMINOPHEN 325 MG PO TABS
650.0000 mg | ORAL_TABLET | Freq: Four times a day (QID) | ORAL | Status: DC | PRN
  Administered 2019-11-08: 650 mg via ORAL
  Filled 2019-11-06 (×3): qty 2

## 2019-11-06 MED ORDER — GUAIFENESIN-DM 100-10 MG/5ML PO SYRP
10.0000 mL | ORAL_SOLUTION | ORAL | Status: DC | PRN
  Administered 2019-11-07: 10 mL via ORAL
  Filled 2019-11-06 (×3): qty 10

## 2019-11-06 MED ORDER — ASCORBIC ACID 500 MG PO TABS
500.0000 mg | ORAL_TABLET | Freq: Every day | ORAL | Status: DC
  Administered 2019-11-06 – 2019-11-10 (×5): 500 mg via ORAL
  Filled 2019-11-06 (×5): qty 1

## 2019-11-06 MED ORDER — SODIUM CHLORIDE 0.9 % IV SOLN
500.0000 mg | INTRAVENOUS | Status: DC
  Administered 2019-11-06 – 2019-11-08 (×3): 500 mg via INTRAVENOUS
  Filled 2019-11-06 (×3): qty 500

## 2019-11-06 MED ORDER — AMLODIPINE BESYLATE 10 MG PO TABS
10.0000 mg | ORAL_TABLET | Freq: Every day | ORAL | Status: DC
  Administered 2019-11-06 – 2019-11-10 (×5): 10 mg via ORAL
  Filled 2019-11-06 (×4): qty 1
  Filled 2019-11-06: qty 2

## 2019-11-06 MED ORDER — SODIUM CHLORIDE 0.9 % IV SOLN
1000.0000 mL | INTRAVENOUS | Status: DC
  Administered 2019-11-06 (×3): 1000 mL via INTRAVENOUS

## 2019-11-06 MED ORDER — SODIUM CHLORIDE 0.9 % IV SOLN
100.0000 mg | Freq: Every day | INTRAVENOUS | Status: DC
Start: 2019-11-07 — End: 2019-11-06

## 2019-11-06 MED ORDER — SODIUM CHLORIDE 0.9 % IV SOLN
2.0000 g | INTRAVENOUS | Status: DC
  Administered 2019-11-06: 2 g via INTRAVENOUS
  Filled 2019-11-06 (×2): qty 20

## 2019-11-06 MED ORDER — SODIUM CHLORIDE 0.9 % IV SOLN: 200.0000 mg | Freq: Once | INTRAVENOUS | Status: DC

## 2019-11-06 NOTE — ED Notes (Signed)
This RN triaged pt. Hand off given to [facility], [place]

## 2019-11-06 NOTE — H&P (Addendum)
History and Physical    Olivia Love PIR:[id number] DOB: 1956/03/06 DOA: 11/06/2019  PCP: Crissie Figures, PA-C   Patient coming from: Home  I have personally briefly reviewed patient's old medical records in [place]  Chief Complaint: Shortness of breath  HPI: Olivia Love is a 64 y.o. female with medical history significant for HTN and morbid obesity who presents to the ER for evaluation of progressively worsening SOB associated with cough, fatigue, intermittent fever, diarrhea and malaise.  She also complains of anorexia, loss of taste and smell.  Patient tested positive for the COVID 19 virus on 10/29/19 and she is unvaccinated.  Her symptoms started on 10/24/2019.   EMS was called because she had trouble breathing. Per EMS her room air pulse oximetry was in the 80's and so she was placed on a 15L NRM. She is currently down to 8L. She denies having any chest pain, nausea, vomiting, abdominal pain, dizziness, lightheadedness, urinary symptoms. Labs reveal potassium of 2.9, LDH 281, Ferritin 282, Procalcitonin < 0.10 CXR reviewed by me shows progressive peripheral patchy airspace disease bilaterally. Findings are consistent with multifocal pneumonia in the setting of COVID-19. 12 Lead EKG reviewed by me shows sinus rhythm    ED Course: 64 year old unvaccinated female, COVID + who presents to the ER for evaluation of progressive shortness of breath and hypoxia. Pulse oximetry was in the 80's on room air and she was placed on a 15L NRM. She has been weaned down to 8L. CXR shows multifocal pneumonia. Patient received Remdesivir and Decadron and will be admitted to the hospital for further evaluation.  Review of Systems: As per HPI otherwise 10 point review of systems negative.    Past Medical History:  Diagnosis Date  . Hypertension     Past Surgical History:  Procedure Laterality Date  . ABDOMINAL HYSTERECTOMY    . CESAREAN SECTION    . CHOLECYSTECTOMY N/A 02/02/2016    Procedure: LAPAROSCOPIC CHOLECYSTECTOMY;  Surgeon: Dia Crawford III, MD;  Location: [facility] ORS;  Service: General;  Laterality: N/A;  . TONSILLECTOMY       reports that she has never smoked. She has never used smokeless tobacco. She reports that she does not drink alcohol and does not use drugs.  Allergies  Allergen Reactions  . Morphine And Related Itching    Family History  Problem Relation Age of Onset  . Breast cancer Cousin        mat cousin     Prior to Admission medications   Medication Sig Start Date End Date Taking? Authorizing Provider  Biotin (BIOTIN MAXIMUM STRENGTH) 10 MG TABS Take 10 mg by mouth daily.    [provider]  cholecalciferol (VITAMIN D) 1000 units tablet Take 1,000 Units by mouth daily.    [provider]  ferrous sulfate 325 (65 FE) MG tablet Take 325 mg by mouth daily with breakfast.    [provider]  hydrochlorothiazide (MICROZIDE) 12.5 MG capsule Take 12.5 mg by mouth daily.    [provider]  ibuprofen (ADVIL,MOTRIN) 800 MG tablet Take 1 tablet (800 mg total) by mouth every 8 (eight) hours as needed. 02/04/16   Loflin, Lavona Mound, MD  omega-3 acid ethyl esters (LOVAZA) 1 g capsule Take 1 g by mouth daily.    [provider]  oxyCODONE 10 MG TABS Take 1 tablet (10 mg total) by mouth every 3 (three) hours as needed for severe pain or breakthrough pain. 02/04/16   Susa Griffins  L, MD    Physical Exam: Vitals:   11/06/19 0830 11/06/19 0900 11/06/19 0930 11/06/19 0951  BP: (!) 146/99 (!) 147/100 (!) 149/101   Pulse: 98 92 97 100  Resp: (!) 28 (!) 33 (!) 32 (!) 28  Temp:      TempSrc:      SpO2: 100% 100% 96% 96%  Weight:      Height:         Vitals:   11/06/19 0830 11/06/19 0900 11/06/19 0930 11/06/19 0951  BP: (!) 146/99 (!) 147/100 (!) 149/101   Pulse: 98 92 97 100  Resp: (!) 28 (!) 33 (!) 32 (!) 28  Temp:      TempSrc:      SpO2: 100% 100% 96% 96%  Weight:      Height:         Constitutional: NAD, alert and oriented x 3.  Patient has conversational dyspnea Eyes: PERRL, lids and conjunctivae pallor ENMT: Mucous membranes are moist.  Neck: normal, supple, no masses, no thyromegaly Respiratory: Air movement in all lung fields, no wheezing, no crackles. Normal respiratory effort. No accessory muscle use.  Cardiovascular: Regular rate and rhythm, no murmurs / rubs / gallops. No extremity edema. 2+ pedal pulses. No carotid bruits.  Abdomen: no tenderness, no masses palpated. No hepatosplenomegaly. Bowel sounds positive.  Musculoskeletal: no clubbing / cyanosis. No joint deformity upper and lower extremities.  Skin: no rashes, lesions, ulcers.  Neurologic: No gross focal neurologic deficit.  Yes Psychiatric: Normal mood and affect.   Labs on Admission: I have personally reviewed following labs and imaging studies  CBC: Recent Labs  Lab 11/06/19 0755  WBC 8.1  NEUTROABS 4.8  HGB 10.1*  HCT 32.2*  MCV 63.3*  PLT 892   Basic Metabolic Panel: Recent Labs  Lab 11/06/19 0755  NA 138  K 2.9*  CL 101  CO2 25  GLUCOSE 154*  BUN 10  CREATININE 0.86  CALCIUM 8.8*   GFR: Estimated Creatinine Clearance: 76.7 mL/min (by C-G formula based on SCr of 0.86 mg/dL). Liver Function Tests: Recent Labs  Lab 11/06/19 0755  AST 30  ALT 25  ALKPHOS 74  BILITOT 1.4*  PROT 7.8  ALBUMIN 3.7   No results for input(s): LIPASE, AMYLASE in the last 168 hours. No results for input(s): AMMONIA in the last 168 hours. Coagulation Profile: No results for input(s): INR, PROTIME in the last 168 hours. Cardiac Enzymes: No results for input(s): CKTOTAL, CKMB, CKMBINDEX, TROPONINI in the last 168 hours. BNP (last 3 results) No results for input(s): PROBNP in the last 8760 hours. HbA1C: No results for input(s): HGBA1C in the last 72 hours. CBG: No results for input(s): GLUCAP in the last 168 hours. Lipid Profile: Recent Labs    11/06/19 0755  TRIG 184*   Thyroid  Function Tests: No results for input(s): TSH, T4TOTAL, FREET4, T3FREE, THYROIDAB in the last 72 hours. Anemia Panel: Recent Labs    11/06/19 0755  FERRITIN 282   Urine analysis:    Component Value Date/Time   COLORURINE YELLOW (A) 02/01/2016 0000   APPEARANCEUR CLEAR (A) 02/01/2016 0000   LABSPEC 1.057 (H) 02/01/2016 0000   PHURINE 6.0 02/01/2016 0000   GLUCOSEU 50 (A) 02/01/2016 0000   HGBUR NEGATIVE 02/01/2016 0000   BILIRUBINUR NEGATIVE 02/01/2016 0000   KETONESUR 1+ (A) 02/01/2016 0000   PROTEINUR NEGATIVE 02/01/2016 0000   NITRITE NEGATIVE 02/01/2016 0000   LEUKOCYTESUR NEGATIVE 02/01/2016 0000    Radiological Exams on Admission:  DG Chest Port 1 View  Result Date: 11/06/2019 CLINICAL DATA:  Shortness of breath.  COVID positive. EXAM: PORTABLE CHEST 1 VIEW COMPARISON:  Two-view chest x-ray 10/29/2019 FINDINGS: Heart size is normal. Lung volumes have decreased. Progressive peripheral patchy airspace disease is present bilaterally, slightly worse at the left base and peripheral right lung. Degenerative changes are present in the thoracic spine and shoulders. IMPRESSION: Progressive peripheral patchy airspace disease bilaterally. Findings are consistent with multifocal pneumonia in the setting of COVID-19. Electronically Signed   By: San Morelle M.D.   On: 11/06/2019 07:43    EKG: Independently reviewed. Sinus rhythm  Assessment/Plan Principal Problem:   Acute respiratory failure due to COVID-19 [facility]) Active Problems:   Pneumonia due to COVID-19 virus   Obesity, Class III, BMI 40-49.9 (morbid obesity) ([place])   Hypertension     Acute respiratory failure secondary to COVID 19 Patient tested positive for the COVID 19 virus on 10/29/19 and presented for evaluation of worsening shortness of breath associated with increased work of breathing and tachypnea Room air pulse oximetry was in the 80's requiring oxygen supplementation.  She is currently on 8L NRM Continue  oxygen supplementation to maintain pulse oximetry > 92%   Pneumonia due to COVID 19 virus Will place patient on Remdesivir and Decadron per protocol   Morbid obesity (BMI 92.92) Complicates overall prognosis and care   Hypertension Will start patient on Amlodipine 10mg  daily     DVT prophylaxis: Lovenox Code Status: Full code Family Communication: Greater than 50% of time was spent discussing patient's condition and plan of care with her at site.  Status and concerns have been addressed.  She verbalizes understanding and agrees with the plan. Disposition Plan: Back to previous home environment Consults called: None    Tiera Mensinger MD [facility]     11/06/2019, 10:09 AM

## 2019-11-06 NOTE — Progress Notes (Signed)
Anticoagulation monitoring(Lovenox):  64 yo female ordered Lovenox 40 mg Q24h  Filed Weights   11/06/19 0705  Weight: 106.6 kg (235 lb)   Body mass index is 40.34 kg/m.    Lab Results  Component Value Date   CREATININE 0.86 11/06/2019   CREATININE 0.86 10/29/2019   CREATININE 0.84 02/04/2016   Estimated Creatinine Clearance: 76.7 mL/min (by C-G formula based on SCr of 0.86 mg/dL). Hemoglobin & Hematocrit     Component Value Date/Time   HGB 10.1 (L) 11/06/2019 0755   HGB 10.1 (L) 12/02/2006 1122   HCT 32.2 (L) 11/06/2019 0755   HCT 31.7 (L) 12/02/2006 1122     Per Protocol for Patient with estCrcl > 30 ml/min and BMI > 40, will transition to Lovenox 40 mg Q12h.

## 2019-11-06 NOTE — ED Triage Notes (Signed)
Pt BIB EMS for difficulty breathing for 1 week since being diagnosed with covid. EMS states room air oxygen saturation was in the 80s so they placed pt on a non-rebreather at 15lpm.

## 2019-11-06 NOTE — ED Provider Notes (Signed)
[facility] Emergency Department Provider Note    First MD Initiated Contact with Patient 11/06/19 [phone]     (approximate)  I have reviewed the triage vital signs and the nursing notes.   HISTORY  Chief Complaint Shortness of Breath    HPI Olivia Love is a 64 y.o. female   with a history of high blood pressure presents to the ER for several days of progressively worsening cough shortness of breath malaise and fatigue.  Feels like she needs to cough but has not been able to cough anything up.  She did test positive for Covid on the first this month.  Did not get her vaccinations.  No history of lung disease.  States her only diagnosis is high blood pressure.   Past Medical History:  Diagnosis Date  . Hypertension    Family History  Problem Relation Age of Onset  . Breast cancer Cousin        mat cousin   Past Surgical History:  Procedure Laterality Date  . ABDOMINAL HYSTERECTOMY    . CESAREAN SECTION    . CHOLECYSTECTOMY N/A 02/02/2016   Procedure: LAPAROSCOPIC CHOLECYSTECTOMY;  Surgeon: Dia Crawford III, MD;  Location: [facility] ORS;  Service: General;  Laterality: N/A;  . TONSILLECTOMY     Patient Active Problem List   Diagnosis Date Noted  . Pneumonia due to COVID-19 virus 11/06/2019      Prior to Admission medications   Medication Sig Start Date End Date Taking? Authorizing Provider  Biotin (BIOTIN MAXIMUM STRENGTH) 10 MG TABS Take 10 mg by mouth daily.    [provider]  cholecalciferol (VITAMIN D) 1000 units tablet Take 1,000 Units by mouth daily.    [provider]  ferrous sulfate 325 (65 FE) MG tablet Take 325 mg by mouth daily with breakfast.    [provider]  hydrochlorothiazide (MICROZIDE) 12.5 MG capsule Take 12.5 mg by mouth daily.    [provider]  ibuprofen (ADVIL,MOTRIN) 800 MG tablet Take 1 tablet (800 mg total) by mouth every 8 (eight) hours as needed. 02/04/16   Loflin, Lavona Mound, MD    omega-3 acid ethyl esters (LOVAZA) 1 g capsule Take 1 g by mouth daily.    [provider]  oxyCODONE 10 MG TABS Take 1 tablet (10 mg total) by mouth every 3 (three) hours as needed for severe pain or breakthrough pain. 02/04/16   Hubbard Teea Ducey, MD    Allergies Morphine and related    Social History Social History   Tobacco Use  . Smoking status: Never Smoker  . Smokeless tobacco: Never Used  Substance Use Topics  . Alcohol use: No  . Drug use: No    Review of Systems Patient denies headaches, rhinorrhea, blurry vision, numbness, shortness of breath, chest pain, edema, cough, abdominal pain, nausea, vomiting, diarrhea, dysuria, fevers, rashes or hallucinations unless otherwise stated above in HPI. ____________________________________________   PHYSICAL EXAM:  VITAL SIGNS: Vitals:   11/06/19 0930 11/06/19 0951  BP: (!) 149/101   Pulse: 97 100  Resp: (!) 32 (!) 28  Temp:    SpO2: 96% 96%    Constitutional: Alert and oriented. In mild resp distress Eyes: Conjunctivae are normal.  Head: Atraumatic. Nose: No congestion/rhinnorhea. Mouth/Throat: Mucous membranes are moist.   Neck: No stridor. Painless ROM.  Cardiovascular: Normal rate, regular rhythm. Grossly normal heart sounds.  Good peripheral circulation. Respiratory: tachypnea with inspiratory crackles throughout Gastrointestinal: Soft and nontender. No distention. No abdominal  bruits. No CVA tenderness. Genitourinary:  Musculoskeletal: No lower extremity tenderness nor edema.  No joint effusions. Neurologic:  Normal speech and language. No gross focal neurologic deficits are appreciated. No facial droop Skin:  Skin is warm, dry and intact. No rash noted. Psychiatric: Mood and affect are normal. Speech and behavior are normal.  ____________________________________________   LABS (all labs ordered are listed, but only abnormal results are displayed)  Results for orders placed or performed during  the hospital encounter of 11/06/19 (from the past 24 hour(s))  Lactic acid, plasma     Status: None   Collection Time: 11/06/19  7:55 AM  Result Value Ref Range   Lactic Acid, Venous 1.4 0.5 - 1.9 mmol/L  CBC WITH DIFFERENTIAL     Status: Abnormal   Collection Time: 11/06/19  7:55 AM  Result Value Ref Range   WBC 8.1 4.0 - 10.5 K/uL   RBC 5.09 3.87 - 5.11 MIL/uL   Hemoglobin 10.1 (L) 12.0 - 15.0 g/dL   HCT 32.2 (L) 36 - 46 %   MCV 63.3 (L) 80.0 - 100.0 fL   MCH 19.8 (L) 26.0 - 34.0 pg   MCHC 31.4 30.0 - 36.0 g/dL   RDW 15.9 (H) 11.5 - 15.5 %   Platelets 338 150 - 400 K/uL   nRBC 0.9 (H) 0.0 - 0.2 %   Neutrophils Relative % 60 %   Neutro Abs 4.8 1.7 - 7.7 K/uL   Lymphocytes Relative 32 %   Lymphs Abs 2.6 0.7 - 4.0 K/uL   Monocytes Relative 6 %   Monocytes Absolute 0.5 0 - 1 K/uL   Eosinophils Relative 0 %   Eosinophils Absolute 0.0 0 - 0 K/uL   Basophils Relative 0 %   Basophils Absolute 0.0 0 - 0 K/uL   WBC Morphology MORPHOLOGY UNREMARKABLE    Immature Granulocytes 2 %   Abs Immature Granulocytes 0.17 (H) 0.00 - 0.07 K/uL   Schistocytes PRESENT    Tear Drop Cells PRESENT    Polychromasia PRESENT    Target Cells PRESENT    Giant PLTs PRESENT   Comprehensive metabolic panel     Status: Abnormal   Collection Time: 11/06/19  7:55 AM  Result Value Ref Range   Sodium 138 135 - 145 mmol/L   Potassium 2.9 (L) 3.5 - 5.1 mmol/L   Chloride 101 98 - 111 mmol/L   CO2 25 22 - 32 mmol/L   Glucose, Bld 154 (H) 70 - 99 mg/dL   BUN 10 8 - 23 mg/dL   Creatinine, Ser 0.86 0.44 - 1.00 mg/dL   Calcium 8.8 (L) 8.9 - 10.3 mg/dL   Total Protein 7.8 6.5 - 8.1 g/dL   Albumin 3.7 3.5 - 5.0 g/dL   AST 30 15 - 41 U/L   ALT 25 0 - 44 U/L   Alkaline Phosphatase 74 38 - 126 U/L   Total Bilirubin 1.4 (H) 0.3 - 1.2 mg/dL   GFR calc non Af Amer >60 >60 mL/min   GFR calc Af Amer >60 >60 mL/min   Anion gap 12 5 - 15  Fibrin derivatives D-Dimer     Status: Abnormal   Collection Time: 11/06/19   7:55 AM  Result Value Ref Range   Fibrin derivatives D-dimer ([facility]) 1,963.06 (H) 0.00 - 499.00 ng/mL (FEU)  Procalcitonin     Status: None   Collection Time: 11/06/19  7:55 AM  Result Value Ref Range   Procalcitonin <0.10 ng/mL  Lactate dehydrogenase  Status: Abnormal   Collection Time: 11/06/19  7:55 AM  Result Value Ref Range   LDH 281 (H) 98 - 192 U/L  Ferritin     Status: None   Collection Time: 11/06/19  7:55 AM  Result Value Ref Range   Ferritin 282 11 - 307 ng/mL  Triglycerides     Status: Abnormal   Collection Time: 11/06/19  7:55 AM  Result Value Ref Range   Triglycerides 184 (H) <150 mg/dL  Fibrinogen     Status: Abnormal   Collection Time: 11/06/19  7:55 AM  Result Value Ref Range   Fibrinogen >750 (H) 210 - 475 mg/dL   ____________________________________________  EKG My review and personal interpretation at Time: 9:49   Indication: resp distress  Rate: 95  Rhythm: sinus Axis: normal Other: normal intervals, borderline prolonged qt  ____________________________________________  RADIOLOGY  I personally reviewed all radiographic images ordered to evaluate for the above acute complaints and reviewed radiology reports and findings.  These findings were personally discussed with the patient.  Please see medical record for radiology report.  ____________________________________________   PROCEDURES  Procedure(s) performed:  .Critical Care Performed by: Merlyn Lot, MD Authorized by: Merlyn Lot, MD   Critical care provider statement:    Critical care time (minutes):  35   Critical care time was exclusive of:  Separately billable procedures and treating other patients   Critical care was necessary to treat or prevent imminent or life-threatening deterioration of the following conditions:  Respiratory failure   Critical care was time spent personally by me on the following activities:  Development of treatment plan with patient or surrogate,  discussions with consultants, evaluation of patient's response to treatment, examination of patient, obtaining history from patient or surrogate, ordering and performing treatments and interventions, ordering and review of laboratory studies, ordering and review of radiographic studies, pulse oximetry, re-evaluation of patient's condition and review of old charts      Critical Care performed: yes ____________________________________________   INITIAL IMPRESSION / [place] / ED COURSE  Pertinent labs & imaging results that were available during my care of the patient were reviewed by me and considered in my medical decision making (see chart for details).   DDX: Covid, Asthma, copd, CHF, pna, ptx, malignancy, Pe, anemia   Olivia Love is a 64 y.o. who presents to the ED with presentation as described above with evidence of acute respiratory failure with hypoxia likely secondary to COVID-19 infection.  Blood will be sent for the by differential.  Providing supplemental oxygen.  She is currently protecting her airway. The patient will be placed on continuous pulse oximetry and telemetry for monitoring.  Laboratory evaluation will be sent to evaluate for the above complaints.     Clinical Course as of Nov 06 950  Mon Nov 06, 2019  1191 Patient's work-up is consistent with COVID-19 and sepsis multilobar pneumonia.  Does have bumped inflammatory markers.  I am able to wean her oxygen down to around 8 L we will continue to try to wean.  She still protecting her airway.  Will discuss with hospitalist for admission for further medical treatment.   [PR]    Clinical Course User Index [PR] Merlyn Lot, MD    The patient was evaluated in Emergency Department today for the symptoms described in the history of present illness. He/she was evaluated in the context of the global COVID-19 pandemic, which necessitated consideration that the patient might be at risk for infection with  the SARS-CoV-2 virus that causes COVID-19. Institutional protocols and algorithms that pertain to the evaluation of patients at risk for COVID-19 are in a state of rapid change based on information released by regulatory bodies including the CDC and federal and state organizations. These policies and algorithms were followed during the patient's care in the ED.  As part of my medical decision making, I reviewed the following data within the [place] notes reviewed and incorporated, Labs reviewed, notes from prior ED visits and [place] Controlled Substance Database   ____________________________________________   FINAL CLINICAL IMPRESSION(S) / ED DIAGNOSES  Final diagnoses:  Acute respiratory failure with hypoxia ([place])  COVID-19 virus infection      NEW MEDICATIONS STARTED DURING THIS VISIT:  New Prescriptions   No medications on file     Note:  This document was prepared using Dragon voice recognition software and may include unintentional dictation errors.    Merlyn Lot, MD 11/06/19 [phone]

## 2019-11-06 NOTE — Progress Notes (Signed)
Remdesivir - Pharmacy Brief Note   O:  ALT: 35 on 8/1 CXR: none SpO2: 100% on NRB   A/P:  Remdesivir 200 mg IVPB once followed by 100 mg IVPB daily x 4 days.    Rayna Sexton, PharmD, BCPS Clinical Pharmacist 11/06/2019 7:43 AM

## 2019-11-07 LAB — COMPREHENSIVE METABOLIC PANEL
ALT: 23 U/L (ref 0–44)
AST: 25 U/L (ref 15–41)
Albumin: 3.4 g/dL — ABNORMAL LOW (ref 3.5–5.0)
Alkaline Phosphatase: 69 U/L (ref 38–126)
Anion gap: 13 (ref 5–15)
BUN: 11 mg/dL (ref 8–23)
CO2: 20 mmol/L — ABNORMAL LOW (ref 22–32)
Calcium: 8.8 mg/dL — ABNORMAL LOW (ref 8.9–10.3)
Chloride: 106 mmol/L (ref 98–111)
Creatinine, Ser: 0.71 mg/dL (ref 0.44–1.00)
GFR calc Af Amer: >60 mL/min (ref >60)
GFR calc non Af Amer: >60 mL/min (ref >60)
Glucose, Bld: 196 mg/dL — ABNORMAL HIGH (ref 70–99)
Potassium: 3.6 mmol/L (ref 3.5–5.1)
Sodium: 139 mmol/L (ref 135–145)
Total Bilirubin: 1.3 mg/dL — ABNORMAL HIGH (ref 0.3–1.2)
Total Protein: 7.1 g/dL (ref 6.5–8.1)

## 2019-11-07 LAB — CBC WITH DIFFERENTIAL/PLATELET
Abs Immature Granulocytes: 0.25 10*3/uL — ABNORMAL HIGH (ref 0.00–0.07)
Basophils Absolute: 0 10*3/uL (ref 0.0–0.1)
Basophils Relative: 0 %
Eosinophils Absolute: 0 10*3/uL (ref 0.0–0.5)
Eosinophils Relative: 0 %
HCT: 30.3 % — ABNORMAL LOW (ref 36.0–46.0)
Hemoglobin: 9.4 g/dL — ABNORMAL LOW (ref 12.0–15.0)
Immature Granulocytes: 3 %
Lymphocytes Relative: 28 %
Lymphs Abs: 2.6 10*3/uL (ref 0.7–4.0)
MCH: 19.9 pg — ABNORMAL LOW (ref 26.0–34.0)
MCHC: 31 g/dL (ref 30.0–36.0)
MCV: 64.2 fL — ABNORMAL LOW (ref 80.0–100.0)
Monocytes Absolute: 0.4 10*3/uL (ref 0.1–1.0)
Monocytes Relative: 5 %
Neutro Abs: 6.1 10*3/uL (ref 1.7–7.7)
Neutrophils Relative %: 64 %
Platelets: 334 10*3/uL (ref 150–400)
RBC: 4.72 MIL/uL (ref 3.87–5.11)
RDW: 16 % — ABNORMAL HIGH (ref 11.5–15.5)
Smear Review: NORMAL
WBC: 9.4 10*3/uL (ref 4.0–10.5)
nRBC: 0.7 % — ABNORMAL HIGH (ref 0.0–0.2)

## 2019-11-07 LAB — PHOSPHORUS: Phosphorus: 1.5 mg/dL — ABNORMAL LOW (ref 2.5–4.6)

## 2019-11-07 LAB — MAGNESIUM: Magnesium: 2.4 mg/dL (ref 1.7–2.4)

## 2019-11-07 LAB — GLUCOSE, CAPILLARY
Glucose-Capillary: 185 mg/dL — ABNORMAL HIGH (ref 70–99)
Glucose-Capillary: 155 mg/dL — ABNORMAL HIGH (ref 70–99)
Glucose-Capillary: 169 mg/dL — ABNORMAL HIGH (ref 70–99)

## 2019-11-07 LAB — D-DIMER, QUANTITATIVE: D-Dimer, Quant: 6.51 ug/mL-FEU — ABNORMAL HIGH (ref 0.00–0.50)

## 2019-11-07 LAB — FERRITIN: Ferritin: 222 ng/mL (ref 11–307)

## 2019-11-07 LAB — PATHOLOGIST SMEAR REVIEW

## 2019-11-07 LAB — C-REACTIVE PROTEIN: CRP: 3.3 mg/dL — ABNORMAL HIGH (ref <1.0)

## 2019-11-07 MED ORDER — INSULIN ASPART 100 UNIT/ML ~~LOC~~ SOLN
0.0000 [IU] | Freq: Three times a day (TID) | SUBCUTANEOUS | Status: DC
  Administered 2019-11-07 – 2019-11-08 (×3): 2 [IU] via SUBCUTANEOUS
  Administered 2019-11-08: 10:00:00 3 [IU] via SUBCUTANEOUS
  Administered 2019-11-08: 19:00:00 2 [IU] via SUBCUTANEOUS
  Administered 2019-11-09: 09:00:00 5 [IU] via SUBCUTANEOUS
  Filled 2019-11-07 (×4): qty 1

## 2019-11-07 MED ORDER — INSULIN ASPART 100 UNIT/ML ~~LOC~~ SOLN: 0.0000 [IU] | Freq: Every day | SUBCUTANEOUS | Status: DC

## 2019-11-07 MED ORDER — MELATONIN 5 MG PO TABS
5.0000 mg | ORAL_TABLET | Freq: Every day | ORAL | Status: DC
  Administered 2019-11-07: 23:00:00 5 mg via ORAL
  Filled 2019-11-07 (×6): qty 1

## 2019-11-07 MED ORDER — SODIUM PHOSPHATES 45 MMOLE/15ML IV SOLN
30.0000 mmol | Freq: Once | INTRAVENOUS | Status: AC
  Administered 2019-11-07: 30 mmol via INTRAVENOUS
  Filled 2019-11-07: qty 10

## 2019-11-07 NOTE — Progress Notes (Signed)
PROGRESS NOTE    Olivia Love  SHF:[id number] DOB: 04-03-1955 DOA: 11/06/2019 PCP: Crissie Figures, PA-C   Brief Narrative: Taken from H&P.  Olivia Love is a 64 y.o. female with medical history significant for HTN and morbid obesity who presents to the ER for evaluation of progressively worsening SOB associated with cough, fatigue, intermittent fever, diarrhea and malaise.  She also complains of anorexia, loss of taste and smell.  Patient tested positive for the COVID 19 virus on 10/29/19 and she is unvaccinated.  Her symptoms started on 10/24/2019.   On arrival she was hypoxic, initially requiring nonrebreather at 15L, later weaned off up to 4 L this morning.  Chest x-ray with bilateral infiltrate consistent with COVID-19 pneumonia, she was started on remdesivir and Decadron.  Subjective: Patient was complaining of cough, shortness of breath and generalized malaise.  Assessment & Plan:   Principal Problem:   Acute respiratory failure due to COVID-19 [facility]) Active Problems:   Pneumonia due to COVID-19 virus   Obesity, Class III, BMI 40-49.9 (morbid obesity) ([place])   Hypertension  Acute hypoxic respiratory failure secondary to COVID-19 pneumonia. Tested positive on 10/29/2019.  Procalcitonin negative.  Imaging consistent with COVID-19 pneumonia.  She was saturating in mid 90s on 4L this morning. She was initially started on ceftriaxone and azithromycin for concern of superadded bacterial infection. -I will discontinue ceftriaxone as procalcitonin is negative. -Continue with the Zithromax due to his anti-inflammatory effect on lungs. -Continue remdesivir and Decadron-day 2. -Continue to monitor inflammatory markers. -Continue with supportive care.  Hypertension.  Blood pressure mildly elevated.  She was on HCTZ at home.  She was started on amlodipine. -Continue to monitor.  Morbid obesity. Body mass index is 40.34 kg/m.  This will complicate overall  prognosis.  Objective: Vitals:   11/07/19 0055 11/07/19 0518 11/07/19 0804 11/07/19 1208  BP: 132/72 (!) 149/89 139/85 136/80  Pulse: 98 (!) 104 (!) 102 91  Resp: 18 18 20  (!) 22  Temp: 99 F (37.2 C) 98.8 F (37.1 C) 98.9 F (37.2 C) 97.8 F (36.6 C)  TempSrc: Oral Oral Oral Oral  SpO2: 92% 95% 95% 94%  Weight:      Height:        Intake/Output Summary (Last 24 hours) at 11/07/2019 1412 Last data filed at 11/06/2019 1500 Gross per 24 hour  Intake 1056.67 ml  Output --  Net 1056.67 ml   Filed Weights   11/06/19 0705  Weight: 106.6 kg    Examination:  General exam: Morbidly obese lady, appears calm and comfortable  Respiratory system: Clear to auscultation. Respiratory effort normal. Cardiovascular system: S1 & S2 heard, RRR. No JVD, murmurs, Gastrointestinal system: Soft, nontender, nondistended, bowel sounds positive. Central nervous system: Alert and oriented. No focal neurological deficits. Extremities: No edema, no cyanosis, pulses intact and symmetrical. Skin: No rashes, lesions or ulcers Psychiatry: Judgement and insight appear normal.   DVT prophylaxis: Lovenox Code Status: Full Family Communication: Discussed with patient Disposition Plan:  Status is: Inpatient  Remains inpatient appropriate because:Inpatient level of care appropriate due to severity of illness   Dispo: The patient is from: Home              Anticipated d/c is to: Home              Anticipated d/c date is: 3 days              Patient currently is not medically stable to d/c.  Consultants:  None  Procedures:  Antimicrobials:  Zithromax  Data Reviewed: I have personally reviewed following labs and imaging studies  CBC: Recent Labs  Lab 11/06/19 0755 11/06/19 1040 11/07/19 0652  WBC 8.1 7.8 9.4  NEUTROABS 4.8  --  6.1  HGB 10.1* 9.0* 9.4*  HCT 32.2* 29.1* 30.3*  MCV 63.3* 63.7* 64.2*  PLT 338 311 536   Basic Metabolic Panel: Recent Labs  Lab 11/06/19 0755  11/06/19 1040 11/07/19 0652  NA 138  --  139  K 2.9*  --  3.6  CL 101  --  106  CO2 25  --  20*  GLUCOSE 154*  --  196*  BUN 10  --  11  CREATININE 0.86  --  0.71  CALCIUM 8.8*  --  8.8*  MG  --  2.1 2.4  PHOS  --   --  1.5*   GFR: Estimated Creatinine Clearance: 82.4 mL/min (by C-G formula based on SCr of 0.71 mg/dL). Liver Function Tests: Recent Labs  Lab 11/06/19 0755 11/07/19 0652  AST 30 25  ALT 25 23  ALKPHOS 74 69  BILITOT 1.4* 1.3*  PROT 7.8 7.1  ALBUMIN 3.7 3.4*   No results for input(s): LIPASE, AMYLASE in the last 168 hours. No results for input(s): AMMONIA in the last 168 hours. Coagulation Profile: No results for input(s): INR, PROTIME in the last 168 hours. Cardiac Enzymes: No results for input(s): CKTOTAL, CKMB, CKMBINDEX, TROPONINI in the last 168 hours. BNP (last 3 results) No results for input(s): PROBNP in the last 8760 hours. HbA1C: No results for input(s): HGBA1C in the last 72 hours. CBG: Recent Labs  Lab 11/07/19 1229  GLUCAP 185*   Lipid Profile: Recent Labs    11/06/19 0755  TRIG 184*   Thyroid Function Tests: No results for input(s): TSH, T4TOTAL, FREET4, T3FREE, THYROIDAB in the last 72 hours. Anemia Panel: Recent Labs    11/06/19 0755 11/07/19 0652  FERRITIN 282 222   Sepsis Labs: Recent Labs  Lab 11/06/19 0755  PROCALCITON <0.10  LATICACIDVEN 1.4    Recent Results (from the past 240 hour(s))  Resp Panel by RT PCR (RSV, Flu A&B, Covid) - Nasopharyngeal Swab     Status: Abnormal   Collection Time: 10/29/19  6:16 PM   Specimen: Nasopharyngeal Swab  Result Value Ref Range Status   SARS Coronavirus 2 by RT PCR POSITIVE (A) NEGATIVE Final    Comment: RESULT CALLED TO, READ BACK BY AND VERIFIED WITH:  ASHLEY MURRAY ON 10/29/2019 AT 2048 TIK (NOTE) SARS-CoV-2 target nucleic acids are DETECTED.  SARS-CoV-2 RNA is generally detectable in upper respiratory specimens  during the acute phase of infection. Positive results  are indicative of the presence of the identified virus, but do not rule out bacterial infection or co-infection with other pathogens not detected by the test. Clinical correlation with patient history and other diagnostic information is necessary to determine patient infection status. The expected result is Negative.  Fact Sheet for Patients:  PinkCheek.be  Fact Sheet for Healthcare Providers: GravelBags.it  This test is not yet approved or cleared by the [place] FDA and  has been authorized for detection and/or diagnosis of SARS-CoV-2 by FDA under an Emergency Use Authorization (EUA).  This EUA will remain in effect (meaning this test c an be used) for the duration of  the COVID-19 declaration under Section 564(b)(1) of the Act, 21 U.S.C. section 360bbb-3(b)(1), unless the authorization is terminated or revoked sooner.  Influenza A by PCR NEGATIVE NEGATIVE Final   Influenza B by PCR NEGATIVE NEGATIVE Final    Comment: (NOTE) The Xpert Xpress SARS-CoV-2/FLU/RSV assay is intended as an aid in  the diagnosis of influenza from Nasopharyngeal swab specimens and  should not be used as a sole basis for treatment. Nasal washings and  aspirates are unacceptable for Xpert Xpress SARS-CoV-2/FLU/RSV  testing.  Fact Sheet for Patients: PinkCheek.be  Fact Sheet for Healthcare Providers: GravelBags.it  This test is not yet approved or cleared by the [place] FDA and  has been authorized for detection and/or diagnosis of SARS-CoV-2 by  FDA under an Emergency Use Authorization (EUA). This EUA will remain  in effect (meaning this test can be used) for the duration of the  Covid-19 declaration under Section 564(b)(1) of the Act, 21  U.S.C. section 360bbb-3(b)(1), unless the authorization is  terminated or revoked.    Respiratory Syncytial Virus by PCR NEGATIVE  NEGATIVE Final    Comment: (NOTE) Fact Sheet for Patients: PinkCheek.be  Fact Sheet for Healthcare Providers: GravelBags.it  This test is not yet approved or cleared by the [place] FDA and  has been authorized for detection and/or diagnosis of SARS-CoV-2 by  FDA under an Emergency Use Authorization (EUA). This EUA will remain  in effect (meaning this test can be used) for the duration of the  COVID-19 declaration under Section 564(b)(1) of the Act, 21 U.S.C.  section 360bbb-3(b)(1), unless the authorization is terminated or  revoked. Performed at [facility], [address] [place], [place] [address]   Blood Culture (routine x 2)     Status: None (Preliminary result)   Collection Time: 11/06/19  7:55 AM   Specimen: BLOOD  Result Value Ref Range Status   Specimen Description BLOOD BLOOD RIGHT FOREARM  Final   Special Requests   Final    BOTTLES DRAWN AEROBIC AND ANAEROBIC Blood Culture adequate volume   Culture   Final    NO GROWTH < 24 HOURS Performed at [facility], [address] [place], [place] [address]    Report Status PENDING  Incomplete  Blood Culture (routine x 2)     Status: None (Preliminary result)   Collection Time: 11/06/19  7:55 AM   Specimen: BLOOD  Result Value Ref Range Status   Specimen Description BLOOD BLOOD RIGHT WRIST  Final   Special Requests   Final    BOTTLES DRAWN AEROBIC AND ANAEROBIC Blood Culture adequate volume   Culture   Final    NO GROWTH < 24 HOURS Performed at [facility], [address], [place], [place] [address]    Report Status PENDING  Incomplete     Radiology Studies: DG Chest Port 1 View  Result Date: 11/06/2019 CLINICAL DATA:  Shortness of breath.  COVID positive. EXAM: PORTABLE CHEST 1 VIEW COMPARISON:  Two-view chest x-ray 10/29/2019 FINDINGS: Heart size is normal. Lung volumes have decreased. Progressive peripheral patchy  airspace disease is present bilaterally, slightly worse at the left base and peripheral right lung. Degenerative changes are present in the thoracic spine and shoulders. IMPRESSION: Progressive peripheral patchy airspace disease bilaterally. Findings are consistent with multifocal pneumonia in the setting of COVID-19. Electronically Signed   By: San Morelle M.D.   On: 11/06/2019 07:43    Scheduled Meds: . albuterol  2 puff Inhalation Q6H  . amLODipine  10 mg Oral Daily  . vitamin C  500 mg Oral Daily  . dexamethasone (DECADRON) injection  6  mg Intravenous Q24H  . enoxaparin (LOVENOX) injection  40 mg Subcutaneous Q12H  . insulin aspart  0-5 Units Subcutaneous QHS  . insulin aspart  0-9 Units Subcutaneous TID WC  . melatonin  5 mg Oral QHS  . zinc sulfate  220 mg Oral Daily   Continuous Infusions: . sodium chloride 1,000 mL (11/06/19 2218)  . azithromycin 500 mg (11/07/19 1010)  . remdesivir 100 mg in NS 100 mL 100 mg (11/07/19 1011)  . sodium phosphate  Dextrose 5% IVPB 30 mmol (11/07/19 1243)     LOS: 1 day   Time spent: 40 minutes.  Lorella Nimrod, MD [facility]  If 7PM-7AM, please contact night-coverage [email or web address]  11/07/2019, 2:12 PM   This record has been created using Systems analyst. Errors have been sought and corrected,but may not always be located. Such creation errors do not reflect on the standard of care.

## 2019-11-08 LAB — CBC WITH DIFFERENTIAL/PLATELET
Abs Immature Granulocytes: 0.38 10*3/uL — ABNORMAL HIGH (ref 0.00–0.07)
Basophils Absolute: 0 10*3/uL (ref 0.0–0.1)
Basophils Relative: 0 %
Eosinophils Absolute: 0 10*3/uL (ref 0.0–0.5)
Eosinophils Relative: 0 %
HCT: 30.2 % — ABNORMAL LOW (ref 36.0–46.0)
Hemoglobin: 9.7 g/dL — ABNORMAL LOW (ref 12.0–15.0)
Immature Granulocytes: 3 %
Lymphocytes Relative: 18 %
Lymphs Abs: 2.4 10*3/uL (ref 0.7–4.0)
MCH: 19.9 pg — ABNORMAL LOW (ref 26.0–34.0)
MCHC: 32.1 g/dL (ref 30.0–36.0)
MCV: 61.9 fL — ABNORMAL LOW (ref 80.0–100.0)
Monocytes Absolute: 0.5 10*3/uL (ref 0.1–1.0)
Monocytes Relative: 4 %
Neutro Abs: 9.6 10*3/uL — ABNORMAL HIGH (ref 1.7–7.7)
Neutrophils Relative %: 75 %
Platelets: 371 10*3/uL (ref 150–400)
RBC: 4.88 MIL/uL (ref 3.87–5.11)
RDW: 16.1 % — ABNORMAL HIGH (ref 11.5–15.5)
Smear Review: NORMAL
WBC: 12.9 10*3/uL — ABNORMAL HIGH (ref 4.0–10.5)
nRBC: 0.9 % — ABNORMAL HIGH (ref 0.0–0.2)

## 2019-11-08 LAB — FERRITIN: Ferritin: 242 ng/mL (ref 11–307)

## 2019-11-08 LAB — FIBRIN DERIVATIVES D-DIMER (ARMC ONLY): Fibrin derivatives D-dimer (ARMC): 4042.92 ng/mL (FEU) — ABNORMAL HIGH (ref 0.00–499.00)

## 2019-11-08 LAB — COMPREHENSIVE METABOLIC PANEL
ALT: 21 U/L (ref 0–44)
AST: 23 U/L (ref 15–41)
Albumin: 3.4 g/dL — ABNORMAL LOW (ref 3.5–5.0)
Alkaline Phosphatase: 71 U/L (ref 38–126)
Anion gap: 11 (ref 5–15)
BUN: 12 mg/dL (ref 8–23)
CO2: 21 mmol/L — ABNORMAL LOW (ref 22–32)
Calcium: 9.1 mg/dL (ref 8.9–10.3)
Chloride: 107 mmol/L (ref 98–111)
Creatinine, Ser: 0.71 mg/dL (ref 0.44–1.00)
GFR calc Af Amer: >60 mL/min (ref >60)
GFR calc non Af Amer: >60 mL/min (ref >60)
Glucose, Bld: 209 mg/dL — ABNORMAL HIGH (ref 70–99)
Potassium: 3.4 mmol/L — ABNORMAL LOW (ref 3.5–5.1)
Sodium: 139 mmol/L (ref 135–145)
Total Bilirubin: 1.3 mg/dL — ABNORMAL HIGH (ref 0.3–1.2)
Total Protein: 7.2 g/dL (ref 6.5–8.1)

## 2019-11-08 LAB — PHOSPHORUS: Phosphorus: 2.7 mg/dL (ref 2.5–4.6)

## 2019-11-08 LAB — MAGNESIUM: Magnesium: 2.1 mg/dL (ref 1.7–2.4)

## 2019-11-08 LAB — GLUCOSE, CAPILLARY
Glucose-Capillary: 210 mg/dL — ABNORMAL HIGH (ref 70–99)
Glucose-Capillary: 176 mg/dL — ABNORMAL HIGH (ref 70–99)
Glucose-Capillary: 176 mg/dL — ABNORMAL HIGH (ref 70–99)
Glucose-Capillary: 169 mg/dL — ABNORMAL HIGH (ref 70–99)

## 2019-11-08 LAB — C-REACTIVE PROTEIN: CRP: 1.2 mg/dL — ABNORMAL HIGH (ref <1.0)

## 2019-11-08 MED ORDER — POTASSIUM CHLORIDE CRYS ER 20 MEQ PO TBCR
40.0000 meq | EXTENDED_RELEASE_TABLET | Freq: Once | ORAL | Status: AC
  Administered 2019-11-08: 40 meq via ORAL
  Filled 2019-11-08: qty 2

## 2019-11-08 NOTE — Progress Notes (Addendum)
PROGRESS NOTE    Olivia Love  UXN:[id number] DOB: [birth year] DOA: 11/06/2019 PCP: Crissie Figures, PA-C   Brief Narrative: Taken from H&P.  Olivia Love is a 64 y.o. female with medical history significant for HTN and morbid obesity who presents to the ER for evaluation of progressively worsening SOB associated with cough, fatigue, intermittent fever, diarrhea and malaise.  She also complains of anorexia, loss of taste and smell.  Patient tested positive for the COVID 19 virus on 10/29/19 and she is unvaccinated.  Her symptoms started on 10/24/2019.   On arrival she was hypoxic, initially requiring nonrebreather at 15L, later weaned off up to 4 L this morning.  Chest x-ray with bilateral infiltrate consistent with COVID-19 pneumonia, she was started on remdesivir and Decadron.  Subjective: Patient continued to feel weak.  She was complaining of some epigastric and pleuritic chest pain increased with coughing.  Assessment & Plan:   Principal Problem:   Acute respiratory failure due to COVID-19 [facility]) Active Problems:   Pneumonia due to COVID-19 virus   Obesity, Class III, BMI 40-49.9 (morbid obesity) ([place])   Hypertension  Acute hypoxic respiratory failure secondary to COVID-19 pneumonia. Tested positive on 10/29/2019.  Procalcitonin negative.  Imaging consistent with COVID-19 pneumonia.  She was saturating in mid 90s on 2L this morning. She was initially started on ceftriaxone and azithromycin for concern of superadded bacterial infection. Worsening D-dimer with some improvement in CRP -I will discontinue ceftriaxone as procalcitonin is negative. -Continue with the Zithromax due to his anti-inflammatory effect on lungs. -Continue remdesivir and Decadron-day 3. -Continue to monitor inflammatory markers. -Continue with supportive care. -Try weaning her off from oxygen.  Hypokalemia.  Potassium mildly low at 3.1. -Replete potassium and monitor.  Hypertension.  Blood pressure mildly  elevated.  She was on HCTZ at home.  She was started on amlodipine. -Continue to monitor.  Morbid obesity. Body mass index is 40.34 kg/m.  This will complicate overall prognosis.  Objective: Vitals:   11/07/19 2002 11/08/19 0129 11/08/19 0737 11/08/19 1149  BP: (!) 144/81 130/84 (!) 168/94 (!) 160/82  Pulse: (!) 101 87 84 95  Resp: 18 17  (!) 22  Temp: 99 F (37.2 C) 98.1 F (36.7 C) 98.5 F (36.9 C) 98.4 F (36.9 C)  TempSrc: Oral  Axillary Oral  SpO2: 96% 97% 94% 96%  Weight:      Height:       No intake or output data in the 24 hours ending 11/08/19 1549 Filed Weights   11/06/19 0705  Weight: 106.6 kg    Examination:  General.  Morbidly obese lady, in no acute distress. Pulmonary.  Lungs clear bilaterally, normal respiratory effort. CV.  Regular rate and rhythm, no JVD, rub or murmur. Abdomen.  Soft, nontender, nondistended, BS positive. CNS.  Alert and oriented x3.  No focal neurologic deficit. Extremities.  No edema, no cyanosis, pulses intact and symmetrical. Psychiatry.  Judgment and insight appears normal.  DVT prophylaxis: Lovenox Code Status: Full Family Communication: Discussed with patient Disposition Plan:  Status is: Inpatient  Remains inpatient appropriate because:Inpatient level of care appropriate due to severity of illness   Dispo: The patient is from: Home              Anticipated d/c is to: Home              Anticipated d/c date is: 2 days.              Patient currently is  not medically stable to d/c.  Consultants:   None  Procedures:  Antimicrobials:  Zithromax  Data Reviewed: I have personally reviewed following labs and imaging studies  CBC: Recent Labs  Lab 11/06/19 0755 11/06/19 1040 11/07/19 0652 11/08/19 0544  WBC 8.1 7.8 9.4 12.9*  NEUTROABS 4.8  --  6.1 9.6*  HGB 10.1* 9.0* 9.4* 9.7*  HCT 32.2* 29.1* 30.3* 30.2*  MCV 63.3* 63.7* 64.2* 61.9*  PLT 338 311 334 998   Basic Metabolic Panel: Recent Labs  Lab  11/06/19 0755 11/06/19 1040 11/07/19 0652 11/08/19 0544  NA 138  --  139 139  K 2.9*  --  3.6 3.4*  CL 101  --  106 107  CO2 25  --  20* 21*  GLUCOSE 154*  --  196* 209*  BUN 10  --  11 12  CREATININE 0.86  --  0.71 0.71  CALCIUM 8.8*  --  8.8* 9.1  MG  --  2.1 2.4 2.1  PHOS  --   --  1.5* 2.7   GFR: Estimated Creatinine Clearance: 82.4 mL/min (by C-G formula based on SCr of 0.71 mg/dL). Liver Function Tests: Recent Labs  Lab 11/06/19 0755 11/07/19 0652 11/08/19 0544  AST 30 25 23   ALT 25 23 21   ALKPHOS 74 69 71  BILITOT 1.4* 1.3* 1.3*  PROT 7.8 7.1 7.2  ALBUMIN 3.7 3.4* 3.4*   No results for input(s): LIPASE, AMYLASE in the last 168 hours. No results for input(s): AMMONIA in the last 168 hours. Coagulation Profile: No results for input(s): INR, PROTIME in the last 168 hours. Cardiac Enzymes: No results for input(s): CKTOTAL, CKMB, CKMBINDEX, TROPONINI in the last 168 hours. BNP (last 3 results) No results for input(s): PROBNP in the last 8760 hours. HbA1C: Recent Labs    11/08/19 0544  HGBA1C 7.2*   CBG: Recent Labs  Lab 11/07/19 1229 11/07/19 1618 11/07/19 2001 11/08/19 0735 11/08/19 1148  GLUCAP 185* 155* 169* 210* 176*   Lipid Profile: Recent Labs    11/06/19 0755  TRIG 184*   Thyroid Function Tests: No results for input(s): TSH, T4TOTAL, FREET4, T3FREE, THYROIDAB in the last 72 hours. Anemia Panel: Recent Labs    11/07/19 0652 11/08/19 0544  FERRITIN 222 242   Sepsis Labs: Recent Labs  Lab 11/06/19 0755  PROCALCITON <0.10  LATICACIDVEN 1.4    Recent Results (from the past 240 hour(s))  Resp Panel by RT PCR (RSV, Flu A&B, Covid) - Nasopharyngeal Swab     Status: Abnormal   Collection Time: 10/29/19  6:16 PM   Specimen: Nasopharyngeal Swab  Result Value Ref Range Status   SARS Coronavirus 2 by RT PCR POSITIVE (A) NEGATIVE Final    Comment: RESULT CALLED TO, READ BACK BY AND VERIFIED WITH:  ASHLEY MURRAY ON 10/29/2019 AT 2048  TIK (NOTE) SARS-CoV-2 target nucleic acids are DETECTED.  SARS-CoV-2 RNA is generally detectable in upper respiratory specimens  during the acute phase of infection. Positive results are indicative of the presence of the identified virus, but do not rule out bacterial infection or co-infection with other pathogens not detected by the test. Clinical correlation with patient history and other diagnostic information is necessary to determine patient infection status. The expected result is Negative.  Fact Sheet for Patients:  PinkCheek.be  Fact Sheet for Healthcare Providers: GravelBags.it  This test is not yet approved or cleared by the [place] FDA and  has been authorized for detection and/or diagnosis of SARS-CoV-2 by FDA  under an Emergency Use Authorization (EUA).  This EUA will remain in effect (meaning this test c an be used) for the duration of  the COVID-19 declaration under Section 564(b)(1) of the Act, 21 U.S.C. section 360bbb-3(b)(1), unless the authorization is terminated or revoked sooner.      Influenza A by PCR NEGATIVE NEGATIVE Final   Influenza B by PCR NEGATIVE NEGATIVE Final    Comment: (NOTE) The Xpert Xpress SARS-CoV-2/FLU/RSV assay is intended as an aid in  the diagnosis of influenza from Nasopharyngeal swab specimens and  should not be used as a sole basis for treatment. Nasal washings and  aspirates are unacceptable for Xpert Xpress SARS-CoV-2/FLU/RSV  testing.  Fact Sheet for Patients: PinkCheek.be  Fact Sheet for Healthcare Providers: GravelBags.it  This test is not yet approved or cleared by the [place] FDA and  has been authorized for detection and/or diagnosis of SARS-CoV-2 by  FDA under an Emergency Use Authorization (EUA). This EUA will remain  in effect (meaning this test can be used) for the duration of the  Covid-19  declaration under Section 564(b)(1) of the Act, 21  U.S.C. section 360bbb-3(b)(1), unless the authorization is  terminated or revoked.    Respiratory Syncytial Virus by PCR NEGATIVE NEGATIVE Final    Comment: (NOTE) Fact Sheet for Patients: PinkCheek.be  Fact Sheet for Healthcare Providers: GravelBags.it  This test is not yet approved or cleared by the [place] FDA and  has been authorized for detection and/or diagnosis of SARS-CoV-2 by  FDA under an Emergency Use Authorization (EUA). This EUA will remain  in effect (meaning this test can be used) for the duration of the  COVID-19 declaration under Section 564(b)(1) of the Act, 21 U.S.C.  section 360bbb-3(b)(1), unless the authorization is terminated or  revoked. Performed at [facility], [address] [place], [place] [address]   Blood Culture (routine x 2)     Status: None (Preliminary result)   Collection Time: 11/06/19  7:55 AM   Specimen: BLOOD  Result Value Ref Range Status   Specimen Description BLOOD BLOOD RIGHT FOREARM  Final   Special Requests   Final    BOTTLES DRAWN AEROBIC AND ANAEROBIC Blood Culture adequate volume   Culture   Final    NO GROWTH 2 DAYS Performed at [facility], [address], [place], [place] [address]    Report Status PENDING  Incomplete  Blood Culture (routine x 2)     Status: None (Preliminary result)   Collection Time: 11/06/19  7:55 AM   Specimen: BLOOD  Result Value Ref Range Status   Specimen Description BLOOD BLOOD RIGHT WRIST  Final   Special Requests   Final    BOTTLES DRAWN AEROBIC AND ANAEROBIC Blood Culture adequate volume   Culture   Final    NO GROWTH 2 DAYS Performed at [facility], [address], [place], [place] [address]    Report Status PENDING  Incomplete     Radiology Studies: No results found.  Scheduled Meds: . albuterol  2 puff Inhalation Q6H  . amLODipine  10  mg Oral Daily  . vitamin C  500 mg Oral Daily  . dexamethasone (DECADRON) injection  6 mg Intravenous Q24H  . enoxaparin (LOVENOX) injection  40 mg Subcutaneous Q12H  . insulin aspart  0-5 Units Subcutaneous QHS  . insulin aspart  0-9 Units Subcutaneous TID WC  . melatonin  5 mg Oral QHS  . zinc sulfate  220 mg Oral  Daily   Continuous Infusions: . sodium chloride 1,000 mL (11/06/19 2218)  . remdesivir 100 mg in NS 100 mL 100 mg (11/08/19 1421)     LOS: 2 days   Time spent: 35 minutes.  Lorella Nimrod, MD [facility]  If 7PM-7AM, please contact night-coverage [email or web address]  11/08/2019, 3:49 PM   This record has been created using Systems analyst. Errors have been sought and corrected,but may not always be located. Such creation errors do not reflect on the standard of care.

## 2019-11-09 LAB — FERRITIN: Ferritin: 207 ng/mL (ref 11–307)

## 2019-11-09 LAB — COMPREHENSIVE METABOLIC PANEL
ALT: 20 U/L (ref 0–44)
AST: 22 U/L (ref 15–41)
Albumin: 3.5 g/dL (ref 3.5–5.0)
Alkaline Phosphatase: 69 U/L (ref 38–126)
Anion gap: 11 (ref 5–15)
BUN: 13 mg/dL (ref 8–23)
CO2: 21 mmol/L — ABNORMAL LOW (ref 22–32)
Calcium: 8.9 mg/dL (ref 8.9–10.3)
Chloride: 104 mmol/L (ref 98–111)
Creatinine, Ser: 0.7 mg/dL (ref 0.44–1.00)
GFR calc Af Amer: >60 mL/min (ref >60)
GFR calc non Af Amer: >60 mL/min (ref >60)
Glucose, Bld: 270 mg/dL — ABNORMAL HIGH (ref 70–99)
Potassium: 4 mmol/L (ref 3.5–5.1)
Sodium: 136 mmol/L (ref 135–145)
Total Bilirubin: 1.4 mg/dL — ABNORMAL HIGH (ref 0.3–1.2)
Total Protein: 7.3 g/dL (ref 6.5–8.1)

## 2019-11-09 LAB — CBC WITH DIFFERENTIAL/PLATELET
Abs Immature Granulocytes: 0.64 10*3/uL — ABNORMAL HIGH (ref 0.00–0.07)
Basophils Absolute: 0 10*3/uL (ref 0.0–0.1)
Basophils Relative: 0 %
Eosinophils Absolute: 0 10*3/uL (ref 0.0–0.5)
Eosinophils Relative: 0 %
HCT: 31.7 % — ABNORMAL LOW (ref 36.0–46.0)
Hemoglobin: 10 g/dL — ABNORMAL LOW (ref 12.0–15.0)
Immature Granulocytes: 5 %
Lymphocytes Relative: 21 %
Lymphs Abs: 2.8 10*3/uL (ref 0.7–4.0)
MCH: 20.2 pg — ABNORMAL LOW (ref 26.0–34.0)
MCHC: 31.5 g/dL (ref 30.0–36.0)
MCV: 64.2 fL — ABNORMAL LOW (ref 80.0–100.0)
Monocytes Absolute: 0.6 10*3/uL (ref 0.1–1.0)
Monocytes Relative: 5 %
Neutro Abs: 9 10*3/uL — ABNORMAL HIGH (ref 1.7–7.7)
Neutrophils Relative %: 69 %
Platelets: 388 10*3/uL (ref 150–400)
RBC: 4.94 MIL/uL (ref 3.87–5.11)
RDW: 16.6 % — ABNORMAL HIGH (ref 11.5–15.5)
WBC: 13.1 10*3/uL — ABNORMAL HIGH (ref 4.0–10.5)
nRBC: 1.2 % — ABNORMAL HIGH (ref 0.0–0.2)

## 2019-11-09 LAB — GLUCOSE, CAPILLARY
Glucose-Capillary: 285 mg/dL — ABNORMAL HIGH (ref 70–99)
Glucose-Capillary: 241 mg/dL — ABNORMAL HIGH (ref 70–99)
Glucose-Capillary: 218 mg/dL — ABNORMAL HIGH (ref 70–99)
Glucose-Capillary: 173 mg/dL — ABNORMAL HIGH (ref 70–99)

## 2019-11-09 LAB — HEMOGLOBIN A1C
Hgb A1c MFr Bld: 7.2 % — ABNORMAL HIGH (ref 4.8–5.6)
Mean Plasma Glucose: 160 mg/dL

## 2019-11-09 LAB — PROCALCITONIN: Procalcitonin: <0.1 ng/mL

## 2019-11-09 LAB — C-REACTIVE PROTEIN: CRP: 0.8 mg/dL (ref <1.0)

## 2019-11-09 LAB — FIBRIN DERIVATIVES D-DIMER (ARMC ONLY): Fibrin derivatives D-dimer (ARMC): 2499.28 ng/mL (FEU) — ABNORMAL HIGH (ref 0.00–499.00)

## 2019-11-09 LAB — PHOSPHORUS: Phosphorus: 2.3 mg/dL — ABNORMAL LOW (ref 2.5–4.6)

## 2019-11-09 LAB — MAGNESIUM: Magnesium: 2.3 mg/dL (ref 1.7–2.4)

## 2019-11-09 MED ORDER — K PHOS MONO-SOD PHOS DI & MONO 155-852-130 MG PO TABS
500.0000 mg | ORAL_TABLET | Freq: Once | ORAL | Status: AC
  Administered 2019-11-09: 500 mg via ORAL
  Filled 2019-11-09: qty 2

## 2019-11-09 MED ORDER — INSULIN ASPART 100 UNIT/ML ~~LOC~~ SOLN
0.0000 [IU] | Freq: Three times a day (TID) | SUBCUTANEOUS | Status: DC
  Administered 2019-11-09 (×2): 7 [IU] via SUBCUTANEOUS
  Administered 2019-11-10: 09:00:00 15 [IU] via SUBCUTANEOUS
  Filled 2019-11-09 (×2): qty 1

## 2019-11-09 MED ORDER — INSULIN ASPART 100 UNIT/ML ~~LOC~~ SOLN: 0.0000 [IU] | Freq: Every day | SUBCUTANEOUS | Status: DC

## 2019-11-09 NOTE — Progress Notes (Signed)
Inpatient Diabetes Program Recommendations  AACE/ADA: New Consensus Statement on Inpatient Glycemic Control   Target Ranges:  Prepandial:   less than 140 mg/dL      Peak postprandial:   less than 180 mg/dL (1-2 hours)      Critically ill patients:  140 - 180 mg/dL   Results for Olivia Love, Olivia Love (MRN [id number]) as of 11/09/2019 12:30  Ref. Range 11/08/2019 07:35 11/08/2019 11:48 11/08/2019 17:13 11/08/2019 21:25 11/09/2019 08:17 11/09/2019 11:59  Glucose-Capillary Latest Ref Range: 70 - 99 mg/dL 210 (H) 176 (H) 176 (H) 169 (H) 285 (H) 241 (H)  Results for Olivia Love (MRN [id number]) as of 11/09/2019 12:30  Ref. Range 11/08/2019 05:44  Hemoglobin A1C Latest Ref Range: 4.8 - 5.6 % 7.2 (H)   Review of Glycemic Control  Diabetes history: NO Outpatient Diabetes medications: NA Current orders for Inpatient glycemic control: Novolog 0-20 units TID with meals, Novolog 0-5 units QHS; Decadron 6 mg Q24H  Inpatient Diabetes Program Recommendations:    Insulin: If steroids are continued as ordered, please consider ordering Novolog 5 units TID with meals for meal coverage if patient eats at least 50% of meals.  HbgA1C: A1C 7.2% on 11/08/19 indicating an average glucose of 160 mg/dl over the past 2-3 months. Noted patient was seen in ED on 10/29/19 and dx with COVID and given Decadron 10 mg x1 on 10/29/19. Patient presented back to the ED on 11/06/19 and was admitted. Patient has been receiving Decadron since 11/06/19 which is contributing to hyperglycemia and elevated A1C. Is MD planning to dx with DM during this admission or have patient follow up with PCP and have repeat A1C in 3-4 months? If patient is going to be newly dx with DM, please inform patient, nursing staff, and consult diabetes coordinator.  Thanks, Barnie Alderman, RN, MSN, CDE Diabetes Coordinator Inpatient Diabetes Program [phone] (Team Pager from 8am to 5pm)

## 2019-11-09 NOTE — Progress Notes (Signed)
PROGRESS NOTE    Olivia Love  OFB:[id number] DOB: Aug 01, 1955 DOA: 11/06/2019 PCP: Olivia Figures, PA-C   Brief Narrative: Taken from H&P.  Olivia Love is a 64 y.o. female with medical history significant for HTN and morbid obesity who presents to the ER for evaluation of progressively worsening SOB associated with cough, fatigue, intermittent fever, diarrhea and malaise.  She also complains of anorexia, loss of taste and smell.  Patient tested positive for the COVID 19 virus on 10/29/19 and she is unvaccinated.  Her symptoms started on 10/24/2019.   On arrival she was hypoxic, initially requiring nonrebreather at 15L, later weaned off up to 4 L this morning.  Chest x-ray with bilateral infiltrate consistent with COVID-19 pneumonia, she was started on remdesivir and Decadron.  Subjective: Patient was feeling better today.  She was saturating well on room air.  Assessment & Plan:   Principal Problem:   Acute respiratory failure due to COVID-19 [facility]) Active Problems:   Pneumonia due to COVID-19 virus   Obesity, Class III, BMI 40-49.9 (morbid obesity) ([place])   Hypertension  Acute hypoxic respiratory failure secondary to COVID-19 pneumonia. Tested positive on 10/29/2019.  Procalcitonin negative.  Imaging consistent with COVID-19 pneumonia.  She was saturating 98 to 99% on 2 L this morning, stop the oxygen and she was maintaining her saturation in mid 90s on room air. She was initially started on ceftriaxone and azithromycin for concern of superadded bacterial infection.  Ceftriaxone was discontinued due to negative procalcitonin. D-dimer and CRP both trending down now. -Continue with the Zithromax due to his anti-inflammatory effect on lungs. -Continue remdesivir and Decadron-day 3. -Continue to monitor inflammatory markers. -Continue with supportive care.  Hypokalemia.  Resolved -Replete potassium as needed and monitor.  Hypophosphatemia.  Phosphorus at 2.3 today. -Replete  electrolytes and monitor.  Hypertension.  Blood pressure mildly elevated.  She was on HCTZ at home.  She was started on amlodipine. -Continue to monitor.  Morbid obesity. Body mass index is 40.34 kg/m.  This will complicate overall prognosis.  Objective: Vitals:   11/08/19 1715 11/09/19 0815 11/09/19 1158 11/09/19 1642  BP: (!) 161/86 (!) 156/88 135/76 (!) 150/85  Pulse: 98 90 87 89  Resp: 20 (!) 21 16 16   Temp: 98.4 F (36.9 C) 98.8 F (37.1 C) 98.8 F (37.1 C) 97.8 F (36.6 C)  TempSrc: Oral Oral Oral   SpO2: 94% 98% 93% 94%  Weight:      Height:        Intake/Output Summary (Last 24 hours) at 11/09/2019 1656 Last data filed at 11/08/2019 1800 Gross per 24 hour  Intake 240 ml  Output --  Net 240 ml   Filed Weights   11/06/19 0705  Weight: 106.6 kg    Examination:  General.  Well-developed, obese lady, in no acute distress. Pulmonary.  Lungs clear bilaterally, normal respiratory effort. CV.  Regular rate and rhythm, no JVD, rub or murmur. Abdomen.  Soft, nontender, nondistended, BS positive. CNS.  Alert and oriented x3.  No focal neurologic deficit. Extremities.  No edema, no cyanosis, pulses intact and symmetrical. Psychiatry.  Judgment and insight appears normal.  DVT prophylaxis: Lovenox Code Status: Full Family Communication: Discussed with patient Disposition Plan:  Status is: Inpatient  Remains inpatient appropriate because:Inpatient level of care appropriate due to severity of illness   Dispo: The patient is from: Home              Anticipated d/c is to: Home  Anticipated d/c date is: 1 days.              Patient currently is not medically stable to d/c.  Consultants:   None  Procedures:  Antimicrobials:  Zithromax  Data Reviewed: I have personally reviewed following labs and imaging studies  CBC: Recent Labs  Lab 11/06/19 0755 11/06/19 1040 11/07/19 0652 11/08/19 0544 11/09/19 0534  WBC 8.1 7.8 9.4 12.9* 13.1*   NEUTROABS 4.8  --  6.1 9.6* 9.0*  HGB 10.1* 9.0* 9.4* 9.7* 10.0*  HCT 32.2* 29.1* 30.3* 30.2* 31.7*  MCV 63.3* 63.7* 64.2* 61.9* 64.2*  PLT 338 311 334 371 194   Basic Metabolic Panel: Recent Labs  Lab 11/06/19 0755 11/06/19 1040 11/07/19 0652 11/08/19 0544 11/09/19 0534  NA 138  --  139 139 136  K 2.9*  --  3.6 3.4* 4.0  CL 101  --  106 107 104  CO2 25  --  20* 21* 21*  GLUCOSE 154*  --  196* 209* 270*  BUN 10  --  11 12 13   CREATININE 0.86  --  0.71 0.71 0.70  CALCIUM 8.8*  --  8.8* 9.1 8.9  MG  --  2.1 2.4 2.1 2.3  PHOS  --   --  1.5* 2.7 2.3*   GFR: Estimated Creatinine Clearance: 82.4 mL/min (by C-G formula based on SCr of 0.7 mg/dL). Liver Function Tests: Recent Labs  Lab 11/06/19 0755 11/07/19 0652 11/08/19 0544 11/09/19 0534  AST 30 25 23 22   ALT 25 23 21 20   ALKPHOS 74 69 71 69  BILITOT 1.4* 1.3* 1.3* 1.4*  PROT 7.8 7.1 7.2 7.3  ALBUMIN 3.7 3.4* 3.4* 3.5   No results for input(s): LIPASE, AMYLASE in the last 168 hours. No results for input(s): AMMONIA in the last 168 hours. Coagulation Profile: No results for input(s): INR, PROTIME in the last 168 hours. Cardiac Enzymes: No results for input(s): CKTOTAL, CKMB, CKMBINDEX, TROPONINI in the last 168 hours. BNP (last 3 results) No results for input(s): PROBNP in the last 8760 hours. HbA1C: Recent Labs    11/08/19 0544  HGBA1C 7.2*   CBG: Recent Labs  Lab 11/08/19 1713 11/08/19 2125 11/09/19 0817 11/09/19 1159 11/09/19 1641  GLUCAP 176* 169* 285* 241* 218*   Lipid Profile: No results for input(s): CHOL, HDL, LDLCALC, TRIG, CHOLHDL, LDLDIRECT in the last 72 hours. Thyroid Function Tests: No results for input(s): TSH, T4TOTAL, FREET4, T3FREE, THYROIDAB in the last 72 hours. Anemia Panel: Recent Labs    11/08/19 0544 11/09/19 0534  FERRITIN 242 207   Sepsis Labs: Recent Labs  Lab 11/06/19 0755 11/09/19 0534  PROCALCITON <0.10 <0.10  LATICACIDVEN 1.4  --     Recent Results (from  the past 240 hour(s))  Blood Culture (routine x 2)     Status: None (Preliminary result)   Collection Time: 11/06/19  7:55 AM   Specimen: BLOOD  Result Value Ref Range Status   Specimen Description BLOOD BLOOD RIGHT FOREARM  Final   Special Requests   Final    BOTTLES DRAWN AEROBIC AND ANAEROBIC Blood Culture adequate volume   Culture   Final    NO GROWTH 3 DAYS Performed at [facility], [address] [place], [place] [address]    Report Status PENDING  Incomplete  Blood Culture (routine x 2)     Status: None (Preliminary result)   Collection Time: 11/06/19  7:55 AM   Specimen: BLOOD  Result Value Ref Range Status  Specimen Description BLOOD BLOOD RIGHT WRIST  Final   Special Requests   Final    BOTTLES DRAWN AEROBIC AND ANAEROBIC Blood Culture adequate volume   Culture   Final    NO GROWTH 3 DAYS Performed at [facility], [address] [place], [place] [address]    Report Status PENDING  Incomplete     Radiology Studies: No results found.  Scheduled Meds: . albuterol  2 puff Inhalation Q6H  . amLODipine  10 mg Oral Daily  . vitamin C  500 mg Oral Daily  . dexamethasone (DECADRON) injection  6 mg Intravenous Q24H  . enoxaparin (LOVENOX) injection  40 mg Subcutaneous Q12H  . insulin aspart  0-20 Units Subcutaneous TID WC  . insulin aspart  0-5 Units Subcutaneous QHS  . melatonin  5 mg Oral QHS  . zinc sulfate  220 mg Oral Daily   Continuous Infusions: . remdesivir 100 mg in NS 100 mL 100 mg (11/09/19 0900)     LOS: 3 days   Time spent: 30 minutes.  Lorella Nimrod, MD [facility]  If 7PM-7AM, please contact night-coverage [email or web address]  11/09/2019, 4:56 PM   This record has been created using Systems analyst. Errors have been sought and corrected,but may not always be located. Such creation errors do not reflect on the standard of care.

## 2019-11-09 NOTE — Care Management Important Message (Signed)
Important Message  Patient Details  Name: MAREA REASNER MRN: [id number] Date of Birth: 01/06/1956   Medicare Important Message Given:  Yes  Reviewed with patient via room phone due to isolation.  Declined copy of Medicare IM as one already sent to home address by Admitting earlier this week.     Dannette Barbara 11/09/2019, 1:50 PM

## 2019-11-09 NOTE — Plan of Care (Signed)

## 2019-11-10 LAB — CBC WITH DIFFERENTIAL/PLATELET
Abs Immature Granulocytes: 0.8 10*3/uL — ABNORMAL HIGH (ref 0.00–0.07)
Basophils Absolute: 0 10*3/uL (ref 0.0–0.1)
Basophils Relative: 0 %
Eosinophils Absolute: 0 10*3/uL (ref 0.0–0.5)
Eosinophils Relative: 0 %
HCT: 32.6 % — ABNORMAL LOW (ref 36.0–46.0)
Hemoglobin: 10.3 g/dL — ABNORMAL LOW (ref 12.0–15.0)
Immature Granulocytes: 6 %
Lymphocytes Relative: 20 %
Lymphs Abs: 2.8 10*3/uL (ref 0.7–4.0)
MCH: 20 pg — ABNORMAL LOW (ref 26.0–34.0)
MCHC: 31.6 g/dL (ref 30.0–36.0)
MCV: 63.2 fL — ABNORMAL LOW (ref 80.0–100.0)
Monocytes Absolute: 0.8 10*3/uL (ref 0.1–1.0)
Monocytes Relative: 5 %
Neutro Abs: 10 10*3/uL — ABNORMAL HIGH (ref 1.7–7.7)
Neutrophils Relative %: 69 %
Platelets: 348 10*3/uL (ref 150–400)
RBC: 5.16 MIL/uL — ABNORMAL HIGH (ref 3.87–5.11)
RDW: 17.6 % — ABNORMAL HIGH (ref 11.5–15.5)
Smear Review: NORMAL
WBC: 14.4 10*3/uL — ABNORMAL HIGH (ref 4.0–10.5)
nRBC: 1.7 % — ABNORMAL HIGH (ref 0.0–0.2)

## 2019-11-10 LAB — PHOSPHORUS: Phosphorus: 2.7 mg/dL (ref 2.5–4.6)

## 2019-11-10 LAB — COMPREHENSIVE METABOLIC PANEL
ALT: 24 U/L (ref 0–44)
AST: 22 U/L (ref 15–41)
Albumin: 3.5 g/dL (ref 3.5–5.0)
Alkaline Phosphatase: 73 U/L (ref 38–126)
Anion gap: 11 (ref 5–15)
BUN: 14 mg/dL (ref 8–23)
CO2: 21 mmol/L — ABNORMAL LOW (ref 22–32)
Calcium: 9 mg/dL (ref 8.9–10.3)
Chloride: 104 mmol/L (ref 98–111)
Creatinine, Ser: 0.71 mg/dL (ref 0.44–1.00)
GFR calc Af Amer: >60 mL/min (ref >60)
GFR calc non Af Amer: >60 mL/min (ref >60)
Glucose, Bld: 300 mg/dL — ABNORMAL HIGH (ref 70–99)
Potassium: 3.8 mmol/L (ref 3.5–5.1)
Sodium: 136 mmol/L (ref 135–145)
Total Bilirubin: 1.5 mg/dL — ABNORMAL HIGH (ref 0.3–1.2)
Total Protein: 7 g/dL (ref 6.5–8.1)

## 2019-11-10 LAB — PROCALCITONIN: Procalcitonin: <0.1 ng/mL

## 2019-11-10 LAB — MAGNESIUM: Magnesium: 2.2 mg/dL (ref 1.7–2.4)

## 2019-11-10 LAB — GLUCOSE, CAPILLARY
Glucose-Capillary: 302 mg/dL — ABNORMAL HIGH (ref 70–99)
Glucose-Capillary: 285 mg/dL — ABNORMAL HIGH (ref 70–99)

## 2019-11-10 LAB — FERRITIN: Ferritin: 241 ng/mL (ref 11–307)

## 2019-11-10 LAB — FIBRIN DERIVATIVES D-DIMER (ARMC ONLY): Fibrin derivatives D-dimer (ARMC): 1783.62 ng/mL (FEU) — ABNORMAL HIGH (ref 0.00–499.00)

## 2019-11-10 LAB — C-REACTIVE PROTEIN: CRP: 0.7 mg/dL (ref <1.0)

## 2019-11-10 MED ORDER — METFORMIN HCL ER (MOD) 1000 MG PO TB24
1000.0000 mg | ORAL_TABLET | Freq: Every day | ORAL | 0 refills | Status: DC
Start: 2019-11-10 — End: 2020-04-08

## 2019-11-10 MED ORDER — AMLODIPINE BESYLATE 10 MG PO TABS: 10.0000 mg | ORAL_TABLET | Freq: Every day | ORAL | 0 refills | Status: AC

## 2019-11-10 MED ORDER — ALBUTEROL SULFATE HFA 108 (90 BASE) MCG/ACT IN AERS: 2.0000 | INHALATION_SPRAY | Freq: Four times a day (QID) | RESPIRATORY_TRACT | 0 refills | Status: DC

## 2019-11-10 MED ORDER — ZINC SULFATE 220 (50 ZN) MG PO CAPS: 220.0000 mg | ORAL_CAPSULE | Freq: Every day | ORAL | 0 refills | Status: AC

## 2019-11-10 MED ORDER — DEXAMETHASONE 6 MG PO TABS
6.0000 mg | ORAL_TABLET | Freq: Every day | ORAL | 0 refills | Status: AC
Start: 2019-11-10 — End: 2019-11-15

## 2019-11-10 MED ORDER — GUAIFENESIN-DM 100-10 MG/5ML PO SYRP: 10.0000 mL | ORAL_SOLUTION | ORAL | 0 refills | Status: AC | PRN

## 2019-11-10 NOTE — Discharge Summary (Signed)
Physician Discharge Summary  MARISOL GIAMBRA TGG:[id number] DOB: 08/17/1955 DOA: 11/06/2019  PCP: Crissie Figures, PA-C  Admit date: 11/06/2019 Discharge date: 11/10/2019  Admitted From: Home Disposition: Home  Recommendations for Outpatient Follow-up:  1. Follow up with PCP in 1-2 weeks 2. Please obtain BMP/CBC in one week 3. Please follow up on the following pending results: None  Home Health: No Equipment/Devices: None Discharge Condition: Stable CODE STATUS: Full Diet recommendation: Heart Healthy / Carb Modified   Brief/Interim Summary: Delane Stalling Baxteris a 64 y.o.femalewith medical history significant forHTNand morbid obesitywho presents to the ER for evaluation of progressively worsening SOB associated with cough, fatigue, intermittent fever,diarrheaand malaise.She also complains of anorexia, loss of taste and smell. Patienttested positive for theCOVID 19 virus on 10/29/19 and she is unvaccinated.Her symptoms started on 10/24/2019.  On arrival she was hypoxic, initially requiring nonrebreather at 15L, later weaned off up to 4 L this morning.  Chest x-ray with bilateral infiltrate consistent with COVID-19 pneumonia, she was started on remdesivir and Decadron.  She completed a course of remdesivir on 11/10/2019.  She was weaned off from oxygen within 3 days.  She will continue with Decadron for another 5 days to complete a 10-day course. She did initially receive ceftriaxone and azithromycin due to the concern of superadded bacterial infection.  Ceftriaxone was discontinued due to negative procalcitonin.  She did receive 3 days of Zithromax.  During the course of illness she found to have some electrolyte derangements which include hypokalemia and hypophosphatemia which were repleted before discharge.  Her blood glucose was elevated during hospitalization which can be secondary to steroid.  Checked A1c and it was 7.2 which makes her diabetic.  No prior diagnosis of diabetes.   During hospitalization she was managed with insulin and discharge home on Metformin with instructions to follow-up with primary care provider for further management.  Patient is morbidly obese with BMI index above 40.  That can complicate overall prognosis.  She needs extensive counseling regarding weight loss.  Discharge Diagnoses:  Principal Problem:   Acute respiratory failure due to COVID-19 [facility]) Active Problems:   Pneumonia due to COVID-19 virus   Obesity, Class III, BMI 40-49.9 (morbid obesity) ([place])   Hypertension   Discharge Instructions  Discharge Instructions    Diet - low sodium heart healthy   Complete by: As directed    Discharge instructions   Complete by: As directed    It was pleasure taking care of you. You are being given steroids for 5 more days, please take it as directed. We will can continue with supplement for next couple of month. Your blood sugar was elevated, steroids can also cause increased in your blood glucose level.  We checked your A1c and it was 7.2 which makes you diabetic.  You are being started on Metformin and please follow-up with your primary care provider for further management. Keep yourself well-hydrated and stay quarantine for another 10 days.   Increase activity slowly   Complete by: As directed      Allergies as of 11/10/2019      Reactions   Morphine And Related Itching      Medication List    STOP taking these medications   guaiFENesin 600 MG 12 hr tablet Commonly known as: MUCINEX     TAKE these medications   albuterol 108 (90 Base) MCG/ACT inhaler Commonly known as: VENTOLIN HFA Inhale 2 puffs into the lungs every 6 (six) hours.   amLODipine 10 MG tablet Commonly known  as: NORVASC Take 1 tablet (10 mg total) by mouth daily.   BLACK ELDERBERRY PO Take by mouth daily.   cholecalciferol 25 MCG (1000 UNIT) tablet Commonly known as: VITAMIN D Take 1,000 Units by mouth daily.   dexamethasone 6 MG tablet Commonly  known as: DECADRON Take 1 tablet (6 mg total) by mouth daily for 5 days.   guaiFENesin-dextromethorphan 100-10 MG/5ML syrup Commonly known as: ROBITUSSIN DM Take 10 mLs by mouth every 4 (four) hours as needed for cough.   hydrochlorothiazide 12.5 MG capsule Commonly known as: MICROZIDE Take 12.5 mg by mouth daily.   metFORMIN 1000 MG (MOD) 24 hr tablet Commonly known as: GLUMETZA Take 1 tablet (1,000 mg total) by mouth daily with breakfast.   vitamin C 250 MG tablet Commonly known as: ASCORBIC ACID Take 250 mg by mouth daily.   zinc sulfate 220 (50 Zn) MG capsule Take 1 capsule (220 mg total) by mouth daily.       Follow-up Information    Crissie Figures, PA-C. Schedule an appointment as soon as possible for a visit.   Specialty: Physician Assistant Contact information: [place] [address] [phone]              Allergies  Allergen Reactions  . Morphine And Related Itching    Consultations:  None  Procedures/Studies: DG Chest 2 View  Result Date: 10/29/2019 CLINICAL DATA:  Chest pain, shortness of breath EXAM: CHEST - 2 VIEW COMPARISON:  None FINDINGS: Linear radiodensity projecting over the right breast may be external to the patient/draping. Accounting for body habitus, the lungs are clear. No consolidation, features of edema, pneumothorax, or effusion. Pulmonary vascularity is normally distributed. The cardiomediastinal contours are unremarkable. No acute osseous or soft tissue abnormality. Degenerative changes are present in the imaged spine and shoulders. Cholecystectomy clips project over the right upper quadrant. IMPRESSION: 1. No acute cardiopulmonary abnormality. 2. Linear radiodensity projecting over the right breast may be external to the patient/draping. Electronically Signed   By: Lovena Le M.D.   On: 10/29/2019 15:15   DG Chest Port 1 View  Result Date: 11/06/2019 CLINICAL DATA:  Shortness of breath.  COVID positive. EXAM:  PORTABLE CHEST 1 VIEW COMPARISON:  Two-view chest x-ray 10/29/2019 FINDINGS: Heart size is normal. Lung volumes have decreased. Progressive peripheral patchy airspace disease is present bilaterally, slightly worse at the left base and peripheral right lung. Degenerative changes are present in the thoracic spine and shoulders. IMPRESSION: Progressive peripheral patchy airspace disease bilaterally. Findings are consistent with multifocal pneumonia in the setting of COVID-19. Electronically Signed   By: San Morelle M.D.   On: 11/06/2019 07:43    Subjective: Patient was feeling better when seen today. Happy for discharge.  Discharge Exam: Vitals:   11/10/19 0509 11/10/19 0816  BP: 132/70 140/76  Pulse: 90 92  Resp: 19 (!) 25  Temp: 98.8 F (37.1 C) 98.7 F (37.1 C)  SpO2: 95% 94%   Vitals:   11/09/19 2056 11/10/19 0110 11/10/19 0509 11/10/19 0816  BP: (!) 150/94 134/82 132/70 140/76  Pulse: 97 96 90 92  Resp: 19 20 19  (!) 25  Temp: 99.5 F (37.5 C) 99.3 F (37.4 C) 98.8 F (37.1 C) 98.7 F (37.1 C)  TempSrc: Oral Oral Oral Oral  SpO2: 93% 96% 95% 94%  Weight:      Height:        General: Pt is alert, awake, not in acute distress Cardiovascular: RRR, S1/S2 +, no rubs, no  gallops Respiratory: CTA bilaterally, no wheezing, no rhonchi Abdominal: Soft, NT, ND, bowel sounds + Extremities: no edema, no cyanosis   The results of significant diagnostics from this hospitalization (including imaging, microbiology, ancillary and laboratory) are listed below for reference.    Microbiology: Recent Results (from the past 240 hour(s))  Blood Culture (routine x 2)     Status: None (Preliminary result)   Collection Time: 11/06/19  7:55 AM   Specimen: BLOOD  Result Value Ref Range Status   Specimen Description BLOOD BLOOD RIGHT FOREARM  Final   Special Requests   Final    BOTTLES DRAWN AEROBIC AND ANAEROBIC Blood Culture adequate volume   Culture   Final    NO GROWTH 4  DAYS Performed at [facility], [address], [place], [place] [address]    Report Status PENDING  Incomplete  Blood Culture (routine x 2)     Status: None (Preliminary result)   Collection Time: 11/06/19  7:55 AM   Specimen: BLOOD  Result Value Ref Range Status   Specimen Description BLOOD BLOOD RIGHT WRIST  Final   Special Requests   Final    BOTTLES DRAWN AEROBIC AND ANAEROBIC Blood Culture adequate volume   Culture   Final    NO GROWTH 4 DAYS Performed at [facility], [address], [place], [place] [address]    Report Status PENDING  Incomplete     Labs: BNP (last 3 results) No results for input(s): BNP in the last 8760 hours. Basic Metabolic Panel: Recent Labs  Lab 11/06/19 0755 11/06/19 1040 11/07/19 0652 11/08/19 0544 11/09/19 0534 11/10/19 0524  NA 138  --  139 139 136 136  K 2.9*  --  3.6 3.4* 4.0 3.8  CL 101  --  106 107 104 104  CO2 25  --  20* 21* 21* 21*  GLUCOSE 154*  --  196* 209* 270* 300*  BUN 10  --  11 12 13 14   CREATININE 0.86  --  0.71 0.71 0.70 0.71  CALCIUM 8.8*  --  8.8* 9.1 8.9 9.0  MG  --  2.1 2.4 2.1 2.3 2.2  PHOS  --   --  1.5* 2.7 2.3* 2.7   Liver Function Tests: Recent Labs  Lab 11/06/19 0755 11/07/19 0652 11/08/19 0544 11/09/19 0534 11/10/19 0524  AST 30 25 23 22 22   ALT 25 23 21 20 24   ALKPHOS 74 69 71 69 73  BILITOT 1.4* 1.3* 1.3* 1.4* 1.5*  PROT 7.8 7.1 7.2 7.3 7.0  ALBUMIN 3.7 3.4* 3.4* 3.5 3.5   No results for input(s): LIPASE, AMYLASE in the last 168 hours. No results for input(s): AMMONIA in the last 168 hours. CBC: Recent Labs  Lab 11/06/19 0755 11/06/19 0755 11/06/19 1040 11/07/19 0652 11/08/19 0544 11/09/19 0534 11/10/19 0524  WBC 8.1   < > 7.8 9.4 12.9* 13.1* 14.4*  NEUTROABS 4.8  --   --  6.1 9.6* 9.0* 10.0*  HGB 10.1*   < > 9.0* 9.4* 9.7* 10.0* 10.3*  HCT 32.2*   < > 29.1* 30.3* 30.2* 31.7* 32.6*  MCV 63.3*   < > 63.7* 64.2* 61.9* 64.2* 63.2*  PLT 338   < > 311 334 371 388 348    < > = values in this interval not displayed.   Cardiac Enzymes: No results for input(s): CKTOTAL, CKMB, CKMBINDEX, TROPONINI in the last 168 hours. BNP: Invalid input(s): POCBNP CBG: Recent Labs  Lab 11/09/19 0817 11/09/19 1159 11/09/19  1641 11/09/19 2155 11/10/19 0817  GLUCAP 285* 241* 218* 173* 302*   D-Dimer No results for input(s): DDIMER in the last 72 hours. Hgb A1c Recent Labs    11/08/19 0544  HGBA1C 7.2*   Lipid Profile No results for input(s): CHOL, HDL, LDLCALC, TRIG, CHOLHDL, LDLDIRECT in the last 72 hours. Thyroid function studies No results for input(s): TSH, T4TOTAL, T3FREE, THYROIDAB in the last 72 hours.  Invalid input(s): FREET3 Anemia work up Recent Labs    11/09/19 0534 11/10/19 0524  FERRITIN 207 241   Urinalysis    Component Value Date/Time   COLORURINE YELLOW (A) 02/01/2016 0000   APPEARANCEUR CLEAR (A) 02/01/2016 0000   LABSPEC 1.057 (H) 02/01/2016 0000   PHURINE 6.0 02/01/2016 0000   GLUCOSEU 50 (A) 02/01/2016 0000   HGBUR NEGATIVE 02/01/2016 0000   BILIRUBINUR NEGATIVE 02/01/2016 0000   KETONESUR 1+ (A) 02/01/2016 0000   PROTEINUR NEGATIVE 02/01/2016 0000   NITRITE NEGATIVE 02/01/2016 0000   LEUKOCYTESUR NEGATIVE 02/01/2016 0000   Sepsis Labs Invalid input(s): PROCALCITONIN,  WBC,  LACTICIDVEN Microbiology Recent Results (from the past 240 hour(s))  Blood Culture (routine x 2)     Status: None (Preliminary result)   Collection Time: 11/06/19  7:55 AM   Specimen: BLOOD  Result Value Ref Range Status   Specimen Description BLOOD BLOOD RIGHT FOREARM  Final   Special Requests   Final    BOTTLES DRAWN AEROBIC AND ANAEROBIC Blood Culture adequate volume   Culture   Final    NO GROWTH 4 DAYS Performed at [facility], [address], [place], [place] [address]    Report Status PENDING  Incomplete  Blood Culture (routine x 2)     Status: None (Preliminary result)   Collection Time: 11/06/19  7:55 AM   Specimen: BLOOD   Result Value Ref Range Status   Specimen Description BLOOD BLOOD RIGHT WRIST  Final   Special Requests   Final    BOTTLES DRAWN AEROBIC AND ANAEROBIC Blood Culture adequate volume   Culture   Final    NO GROWTH 4 DAYS Performed at [facility], [address] [place], [place] [address]    Report Status PENDING  Incomplete    Time coordinating discharge: Over 30 minutes  SIGNED:  Lorella Nimrod, MD  [facility] 11/10/2019, 10:53 AM  If 7PM-7AM, please contact night-coverage [email or web address]  This record has been created using Systems analyst. Errors have been sought and corrected,but may not always be located. Such creation errors do not reflect on the standard of care.

## 2019-11-11 LAB — CULTURE, BLOOD (ROUTINE X 2)
Culture: NO GROWTH
Culture: NO GROWTH
Special Requests: ADEQUATE
Special Requests: ADEQUATE

## 2020-03-05 ENCOUNTER — Other Ambulatory Visit: Payer: Self-pay | Admitting: Physician Assistant

## 2020-03-05 DIAGNOSIS — Z13820 Encounter for screening for osteoporosis: Secondary | ICD-10-CM

## 2020-04-08 ENCOUNTER — Ambulatory Visit (INDEPENDENT_AMBULATORY_CARE_PROVIDER_SITE_OTHER): Payer: Self-pay | Admitting: Gastroenterology

## 2020-04-08 ENCOUNTER — Other Ambulatory Visit: Payer: Self-pay

## 2020-04-08 ENCOUNTER — Encounter: Payer: Self-pay | Admitting: Gastroenterology

## 2020-04-08 DIAGNOSIS — K921 Melena: Secondary | ICD-10-CM

## 2020-04-08 MED ORDER — NA SULFATE-K SULFATE-MG SULF 17.5-3.13-1.6 GM/177ML PO SOLN: 1.0000 | Freq: Once | ORAL | 0 refills | Status: AC

## 2020-04-08 NOTE — Progress Notes (Signed)
Gastroenterology Pre-Procedure Review  Request Date: Thursday 04/25/20 Requesting Physician: Dr. Vicente Males  PATIENT REVIEW QUESTIONS: The patient responded to the following health history questions as indicated:    1. Are you having any GI issues? yes (Blood in stool) 2. Do you have a personal history of Polyps? no 3. Do you have a family history of Colon Cancer or Polyps? yes (mother colon polyps) 4. Diabetes Mellitus? yes 5. Joint replacements in the past 12 months?no 6. Major health problems in the past 3 months?no 7. Any artificial heart valves, MVP, or defibrillator?no    MEDICATIONS & ALLERGIES:    Patient reports the following regarding taking any anticoagulation/antiplatelet therapy:   Plavix, Coumadin, Eliquis, Xarelto, Lovenox, Pradaxa, Brilinta, or Effient? no Aspirin? no  Patient confirms/reports the following medications:  Current Outpatient Medications  Medication Sig Dispense Refill  . albuterol (VENTOLIN HFA) 108 (90 Base) MCG/ACT inhaler Inhale 2 puffs into the lungs every 6 (six) hours. 8 g 0  . amLODipine (NORVASC) 10 MG tablet Take 1 tablet (10 mg total) by mouth daily. 30 tablet 0  . BLACK ELDERBERRY PO Take by mouth daily.    . cholecalciferol (VITAMIN D) 1000 units tablet Take 1,000 Units by mouth daily.    Marland Kitchen guaiFENesin-dextromethorphan (ROBITUSSIN DM) 100-10 MG/5ML syrup Take 10 mLs by mouth every 4 (four) hours as needed for cough. 118 mL 0  . hydrochlorothiazide (MICROZIDE) 12.5 MG capsule Take 12.5 mg by mouth daily.    . metFORMIN (GLUMETZA) 1000 MG (MOD) 24 hr tablet Take 1 tablet (1,000 mg total) by mouth daily with breakfast. 90 tablet 0  . vitamin C (ASCORBIC ACID) 250 MG tablet Take 250 mg by mouth daily.    Marland Kitchen zinc sulfate 220 (50 Zn) MG capsule Take 1 capsule (220 mg total) by mouth daily. 90 capsule 0   No current facility-administered medications for this visit.    Patient confirms/reports the following allergies:  Allergies  Allergen Reactions   . Morphine And Related Itching    No orders of the defined types were placed in this encounter.   AUTHORIZATION INFORMATION Primary Insurance: 1D#: Group #:  Secondary Insurance: 1D#: Group #:  SCHEDULE INFORMATION: Date: 04/25/20 Time: Location:[facility]

## 2020-04-08 NOTE — Progress Notes (Signed)
Gastroenterology Pre-Procedure Review  Request Date: 04/25/20 Requesting Physician: Dr. Vicente Males  PATIENT REVIEW QUESTIONS: The patient responded to the following health history questions as indicated:    1. Are you having any GI issues? yes (blood in stool) 2. Do you have a personal history of Polyps? no 3. Do you have a family history of Colon Cancer or Polyps?mother polyps 4. Diabetes Mellitus? yes (type 2) 5. Joint replacements in the past 12 months?no 6. Major health problems in the past 3 months?no 7. Any artificial heart valves, MVP, or defibrillator?no    MEDICATIONS & ALLERGIES:    Patient reports the following regarding taking any anticoagulation/antiplatelet therapy:   Plavix, Coumadin, Eliquis, Xarelto, Lovenox, Pradaxa, Brilinta, or Effient? no Aspirin? no  Patient confirms/reports the following medications:  Current Outpatient Medications  Medication Sig Dispense Refill  . ACCU-CHEK GUIDE test strip USE TO CHECK BLOOD SUGAR ONCE DAILY    . Accu-Chek Softclix Lancets lancets SMARTSIG:1 Topical Daily    . albuterol (VENTOLIN HFA) 108 (90 Base) MCG/ACT inhaler Inhale 2 puffs into the lungs every 6 (six) hours. 8 g 0  . BLACK ELDERBERRY PO Take by mouth daily.    . Blood Glucose Monitoring Suppl (ACCU-CHEK GUIDE ME) w/Device KIT     . cholecalciferol (VITAMIN D) 1000 units tablet Take 1,000 Units by mouth daily.    Marland Kitchen FLOVENT HFA 110 MCG/ACT inhaler Inhale 2 puffs into the lungs 2 (two) times daily.    Marland Kitchen guaiFENesin-dextromethorphan (ROBITUSSIN DM) 100-10 MG/5ML syrup Take 10 mLs by mouth every 4 (four) hours as needed for cough. 118 mL 0  . hydrochlorothiazide (MICROZIDE) 12.5 MG capsule Take 12.5 mg by mouth daily.    . metFORMIN (GLUCOPHAGE) 500 MG tablet Take 500 mg by mouth 2 (two) times daily.    . Na Sulfate-K Sulfate-Mg Sulf 17.5-3.13-1.6 GM/177ML SOLN Take 1 kit by mouth once for 1 dose. 354 mL 0  . vitamin C (ASCORBIC ACID) 250 MG tablet Take 250 mg by mouth daily.     Marland Kitchen zinc sulfate 220 (50 Zn) MG capsule Take 1 capsule (220 mg total) by mouth daily. 90 capsule 0  . amLODipine (NORVASC) 10 MG tablet Take 1 tablet (10 mg total) by mouth daily. (Patient not taking: Reported on 04/08/2020) 30 tablet 0   No current facility-administered medications for this visit.    Patient confirms/reports the following allergies:  Allergies  Allergen Reactions  . Morphine Itching  . Morphine And Related Itching    No orders of the defined types were placed in this encounter.   AUTHORIZATION INFORMATION Primary Insurance: 1D#: Group #:  Secondary Insurance: 1D#: Group #:  SCHEDULE INFORMATION: Date: 04/25/20 Time: Location:[facility]

## 2020-04-23 ENCOUNTER — Other Ambulatory Visit: Payer: Self-pay

## 2020-04-23 ENCOUNTER — Other Ambulatory Visit
Admission: RE | Admit: 2020-04-23 | Discharge: 2020-04-23 | Disposition: A | Discharge status: Home / Self Care | Payer: Medicare Other | Source: Ambulatory Visit | Attending: Gastroenterology | Admitting: Gastroenterology

## 2020-04-23 DIAGNOSIS — Z01812 Encounter for preprocedural laboratory examination: Secondary | ICD-10-CM | POA: Diagnosis present

## 2020-04-23 DIAGNOSIS — Z20822 Contact with and (suspected) exposure to covid-19: Secondary | ICD-10-CM | POA: Diagnosis not present

## 2020-04-23 LAB — SARS CORONAVIRUS 2 (TAT 6-24 HRS): SARS Coronavirus 2: NEGATIVE

## 2020-04-25 ENCOUNTER — Ambulatory Visit
Admission: RE | Admit: 2020-04-25 | Discharge: 2020-04-25 | Disposition: A | Discharge status: Home / Self Care | Payer: Medicare Other | Attending: Gastroenterology | Admitting: Gastroenterology

## 2020-04-25 ENCOUNTER — Other Ambulatory Visit: Payer: Self-pay

## 2020-04-25 ENCOUNTER — Encounter: Payer: Self-pay | Admitting: Gastroenterology

## 2020-04-25 ENCOUNTER — Ambulatory Visit: Payer: Medicare Other | Admitting: Anesthesiology

## 2020-04-25 ENCOUNTER — Encounter
Admission: RE | Disposition: A | Discharge status: Home / Self Care | Payer: Self-pay | Source: Home / Self Care | Attending: Gastroenterology

## 2020-04-25 DIAGNOSIS — Z1211 Encounter for screening for malignant neoplasm of colon: Secondary | ICD-10-CM | POA: Insufficient documentation

## 2020-04-25 DIAGNOSIS — Z7984 Long term (current) use of oral hypoglycemic drugs: Secondary | ICD-10-CM | POA: Insufficient documentation

## 2020-04-25 DIAGNOSIS — Z79899 Other long term (current) drug therapy: Secondary | ICD-10-CM | POA: Diagnosis not present

## 2020-04-25 DIAGNOSIS — K635 Polyp of colon: Secondary | ICD-10-CM

## 2020-04-25 DIAGNOSIS — D125 Benign neoplasm of sigmoid colon: Secondary | ICD-10-CM | POA: Diagnosis not present

## 2020-04-25 DIAGNOSIS — D124 Benign neoplasm of descending colon: Secondary | ICD-10-CM | POA: Insufficient documentation

## 2020-04-25 DIAGNOSIS — K5730 Diverticulosis of large intestine without perforation or abscess without bleeding: Secondary | ICD-10-CM | POA: Insufficient documentation

## 2020-04-25 DIAGNOSIS — K921 Melena: Secondary | ICD-10-CM

## 2020-04-25 DIAGNOSIS — Z87891 Personal history of nicotine dependence: Secondary | ICD-10-CM | POA: Insufficient documentation

## 2020-04-25 HISTORY — PX: COLONOSCOPY WITH PROPOFOL: SHX5780

## 2020-04-25 LAB — GLUCOSE, CAPILLARY: Glucose-Capillary: 109 mg/dL — ABNORMAL HIGH (ref 70–99)

## 2020-04-25 SURGERY — COLONOSCOPY WITH PROPOFOL
Anesthesia: General

## 2020-04-25 MED ORDER — SODIUM CHLORIDE 0.9 % IV SOLN: INTRAVENOUS | Status: DC

## 2020-04-25 MED ORDER — PROPOFOL 500 MG/50ML IV EMUL
INTRAVENOUS | Status: DC | PRN
  Administered 2020-04-25: 140 ug/kg/min via INTRAVENOUS

## 2020-04-25 MED ORDER — PROPOFOL 10 MG/ML IV BOLUS
INTRAVENOUS | Status: DC | PRN
  Administered 2020-04-25: 80 mg via INTRAVENOUS

## 2020-04-25 MED ORDER — PHENYLEPHRINE HCL (PRESSORS) 10 MG/ML IV SOLN
INTRAVENOUS | Status: DC | PRN
  Administered 2020-04-25: 100 ug via INTRAVENOUS

## 2020-04-25 NOTE — Op Note (Signed)
[facility] Gastroenterology Patient Name: Olivia Love Procedure Date: 04/25/2020 10:53 AM MRN: [id number] Account #: [id number] Date of Birth: 1955/06/04 Admit Type: Outpatient Age: 65 Room: [facility] ENDO ROOM 3 Gender: Female Note Status: Finalized Procedure:             Colonoscopy Indications:           Screening for colorectal malignant neoplasm Providers:             [name] MD, MD Referring MD:          Jerene Canny, MD (Referring MD) Medicines:             Monitored Anesthesia Care Complications:         No immediate complications. Procedure:             Pre-Anesthesia Assessment:                        - Prior to the procedure, a History and Physical was                         performed, and patient medications, allergies and                         sensitivities were reviewed. The patient's tolerance                         of previous anesthesia was reviewed.                        - The risks and benefits of the procedure and the                         sedation options and risks were discussed with the                         patient. All questions were answered and informed                         consent was obtained.                        - ASA Grade Assessment: II - A patient with mild                         systemic disease.                        After obtaining informed consent, the colonoscope was                         passed under direct vision. Throughout the procedure,                         the patient's blood pressure, pulse, and oxygen                         saturations were monitored continuously. The                         Colonoscope was introduced through the anus and  advanced to the the cecum, identified by the                         appendiceal orifice. The colonoscopy was performed                         with ease. The patient tolerated the procedure well.                         The quality of  the bowel preparation was excellent. Findings:      The perianal and digital rectal examinations were normal.      Multiple small-mouthed diverticula were found in the sigmoid colon.      A 5 mm polyp was found in the descending colon. The polyp was sessile.       The polyp was removed with a cold snare. Resection and retrieval were       complete.      A 20 mm polyp was found in the sigmoid colon. The polyp was       pedunculated. The polyp was removed with a hot snare. Resection and       retrieval were complete. To prevent bleeding post-intervention, one       hemostatic clip was successfully placed. There was no bleeding at the       end of the procedure.      The exam was otherwise without abnormality on direct and retroflexion       views. Impression:            - Diverticulosis in the sigmoid colon.                        - One 5 mm polyp in the descending colon, removed with                         a cold snare. Resected and retrieved.                        - One 20 mm polyp in the sigmoid colon, removed with a                         hot snare. Resected and retrieved. Clip was placed.                        - The examination was otherwise normal on direct and                         retroflexion views. Recommendation:        - Discharge patient to home (with escort).                        - Resume previous diet.                        - Continue present medications.                        - Await pathology results.                        -  Repeat colonoscopy for surveillance based on                         pathology results. Procedure Code(s):     --- Professional ---                        [phone], Colonoscopy, flexible; with removal of                         tumor(s), polyp(s), or other lesion(s) by snare                         technique Diagnosis Code(s):     --- Professional ---                        Z12.11, Encounter for screening for malignant neoplasm                          of colon                        K57.30, Diverticulosis of large intestine without                         perforation or abscess without bleeding                        K63.5, Polyp of colon CPT copyright 2019 American Medical Association. All rights reserved. The codes documented in this report are preliminary and upon coder review may  be revised to meet current compliance requirements. [name], MD [name] MD, MD 04/25/2020 11:19:29 AM This report has been signed electronically. Number of Addenda: 0 Note Initiated On: 04/25/2020 10:53 AM Scope Withdrawal Time: 0 hours 13 minutes 26 seconds  Total Procedure Duration: 0 hours 16 minutes 38 seconds  Estimated Blood Loss:  Estimated blood loss: none.      [facility]

## 2020-04-25 NOTE — Transfer of Care (Signed)
Immediate Anesthesia Transfer of Care Note  Patient: Olivia Love  Procedure(s) Performed: COLONOSCOPY WITH PROPOFOL (N/A )  Patient Location: PACU  Anesthesia Type:General  Level of Consciousness: sedated  Airway & Oxygen Therapy: Patient Spontanous Breathing  Post-op Assessment: Report given to RN and Post -op Vital signs reviewed and stable  Post vital signs: Reviewed and stable  Last Vitals:  Vitals Value Taken Time  BP    Temp    Pulse    Resp    SpO2      Last Pain:  Vitals:   04/25/20 0853  TempSrc: Temporal  PainSc: 0-No pain         Complications: No complications documented.

## 2020-04-25 NOTE — H&P (Signed)
[name], MD [address] [place], [place], [place], [address] [address], [place], [place], [place], [address] Phone: [phone]  Fax: [phone]  Primary Care Physician:  Crissie Figures, PA-C   Pre-Procedure History & Physical: HPI:  Olivia Love is a 65 y.o. female is here for an colonoscopy.   Past Medical History:  Diagnosis Date  . Hypertension     Past Surgical History:  Procedure Laterality Date  . ABDOMINAL HYSTERECTOMY    . CESAREAN SECTION    . CHOLECYSTECTOMY N/A 02/02/2016   Procedure: LAPAROSCOPIC CHOLECYSTECTOMY;  Surgeon: Dia Crawford III, MD;  Location: [facility] ORS;  Service: General;  Laterality: N/A;  . TONSILLECTOMY      Prior to Admission medications   Medication Sig Start Date End Date Taking? Authorizing Provider  hydrochlorothiazide (MICROZIDE) 12.5 MG capsule Take 12.5 mg by mouth daily.   Yes [provider]  metFORMIN (GLUCOPHAGE) 500 MG tablet Take 500 mg by mouth 2 (two) times daily. 03/05/20  Yes [provider]  ACCU-CHEK GUIDE test strip USE TO [place] DAILY 02/20/20   [provider]  Accu-Chek Softclix Lancets lancets SMARTSIG:1 Topical Daily 02/24/20   [provider]  albuterol (VENTOLIN HFA) 108 (90 Base) MCG/ACT inhaler Inhale 2 puffs into the lungs every 6 (six) hours. Patient not taking: Reported on 04/25/2020 11/10/19   Lorella Nimrod, MD  amLODipine (NORVASC) 10 MG tablet Take 1 tablet (10 mg total) by mouth daily. Patient not taking: No sig reported 11/10/19   Lorella Nimrod, MD  BLACK ELDERBERRY PO Take by mouth daily.    [provider]  Blood Glucose Monitoring Suppl (ACCU-CHEK GUIDE ME) w/Device KIT  11/30/19   [provider]  cholecalciferol (VITAMIN D) 1000 units tablet Take 1,000 Units by mouth daily.    [provider]  FLOVENT HFA 110 MCG/ACT inhaler Inhale 2 puffs into the lungs 2 (two) times daily. Patient not taking: Reported on  04/25/2020 02/05/20   [provider]  guaiFENesin-dextromethorphan (ROBITUSSIN DM) 100-10 MG/5ML syrup Take 10 mLs by mouth every 4 (four) hours as needed for cough. Patient not taking: Reported on 04/25/2020 11/10/19   Lorella Nimrod, MD  vitamin C (ASCORBIC ACID) 250 MG tablet Take 250 mg by mouth daily.    [provider]  zinc sulfate 220 (50 Zn) MG capsule Take 1 capsule (220 mg total) by mouth daily. 11/10/19   Lorella Nimrod, MD    Allergies as of 04/08/2020 - Review Complete 04/08/2020  Allergen Reaction Noted  . Morphine Itching 01/09/2019  . Morphine and related Itching 01/31/2016    Family History  Problem Relation Age of Onset  . Breast cancer Cousin        mat cousin    Social History   Socioeconomic History  . Marital status: Single    Spouse name: Not on file  . Number of children: Not on file  . Years of education: Not on file  . Highest education level: Not on file  Occupational History  . Not on file  Tobacco Use  . Smoking status: Former Smoker    Types: Cigarettes  . Smokeless tobacco: Never Used  Vaping Use  . Vaping Use: Never used  Substance and Sexual Activity  . Alcohol use: No  . Drug use: No  . Sexual activity: Not on file  Other Topics Concern  . Not on file  Social History Narrative  . Not on file  Social Determinants of Health   Financial Resource Strain: Not on file  Food Insecurity: Not on file  Transportation Needs: Not on file  Physical Activity: Not on file  Stress: Not on file  Social Connections: Not on file  Intimate Partner Violence: Not on file    Review of Systems: See HPI, otherwise negative ROS  Physical Exam: BP (!) 166/106   Pulse (!) 107   Temp (!) 97 F (36.1 C) (Temporal)   Resp 16   Ht _0  (1.626 m)   Wt 102.1 kg   SpO2 100%   BMI 38.62 kg/m  General:   Alert,  pleasant and cooperative in NAD Head:  Normocephalic and atraumatic. Neck:  Supple; no masses or thyromegaly. Lungs:  Clear  throughout to auscultation, normal respiratory effort.    Heart:  +S1, +S2, Regular rate and rhythm, No edema. Abdomen:  Soft, nontender and nondistended. Normal bowel sounds, without guarding, and without rebound.   Neurologic:  Alert and  oriented x4;  grossly normal neurologically.  Impression/Plan: Olivia Love is here for an colonoscopy to be performed for Screening colonoscopy average risk   Risks, benefits, limitations, and alternatives regarding  colonoscopy have been reviewed with the patient.  Questions have been answered.  All parties agreeable.   [name], MD  04/25/2020, 10:51 AM

## 2020-04-25 NOTE — Anesthesia Preprocedure Evaluation (Addendum)
Anesthesia Evaluation  Patient identified by MRN, date of birth, ID band Patient awake    Reviewed: Allergy & Precautions, NPO status , Patient's Chart, lab work & pertinent test results  Airway Mallampati: II       Dental  (+) Edentulous Upper   Pulmonary pneumonia, resolved, former smoker,    Pulmonary exam normal        Cardiovascular hypertension, Pt. on medications Normal cardiovascular exam     Neuro/Psych negative neurological ROS  negative psych ROS   GI/Hepatic negative GI ROS, Neg liver ROS, cholecystitis   Endo/Other  negative endocrine ROS  Renal/GU negative Renal ROS  negative genitourinary   Musculoskeletal negative musculoskeletal ROS (+)   Abdominal Normal abdominal exam  (+)   Peds negative pediatric ROS (+)  Hematology  (+) anemia ,   Anesthesia Other Findings Past Medical History: No date: Hypertension  Reproductive/Obstetrics                            Anesthesia Physical  Anesthesia Plan  ASA: II  Anesthesia Plan: General   Post-op Pain Management:    Induction:   PONV Risk Score and Plan:   Airway Management Planned: Nasal Cannula  Additional Equipment:   Intra-op Plan:   Post-operative Plan:   Informed Consent: I have reviewed the patients History and Physical, chart, labs and discussed the procedure including the risks, benefits and alternatives for the proposed anesthesia with the patient or authorized representative who has indicated his/her understanding and acceptance.     Dental advisory given  Plan Discussed with: CRNA and Surgeon  Anesthesia Plan Comments:         Anesthesia Quick Evaluation

## 2020-04-26 ENCOUNTER — Encounter: Payer: Self-pay | Admitting: Gastroenterology

## 2020-04-26 LAB — SURGICAL PATHOLOGY

## 2020-04-26 NOTE — Anesthesia Postprocedure Evaluation (Signed)
Anesthesia Post Note  Patient: Olivia Love  Procedure(s) Performed: COLONOSCOPY WITH PROPOFOL (N/A )  Patient location during evaluation: Endoscopy Anesthesia Type: General Level of consciousness: awake and alert and oriented Pain management: pain level controlled Vital Signs Assessment: post-procedure vital signs reviewed and stable Respiratory status: spontaneous breathing Cardiovascular status: blood pressure returned to baseline Anesthetic complications: no   No complications documented.   Last Vitals:  Vitals:   04/25/20 1123 04/25/20 1133  BP: 103/73 126/85  Pulse: 94 90  Resp: 16 13  Temp: (!) 36.1 C   SpO2: 100% 100%    Last Pain:  Vitals:   04/26/20 0749  TempSrc:   PainSc: 0-No pain                 Adalai Perl

## 2020-05-05 ENCOUNTER — Encounter: Payer: Self-pay | Admitting: Gastroenterology

## 2020-05-07 ENCOUNTER — Telehealth: Payer: Self-pay

## 2020-05-07 NOTE — Telephone Encounter (Signed)
-----   Message from [name], MD sent at 05/05/2020 12:55 PM EST ----- Kourtlyn Charlet inform   Large sigmoid colon polyp taken out was very very close to cancer. Margins of polyp were free of abnormal tissue suggesting we got the whole polyp out completely. Suggest repeat sigmoidoscopy in 6 months for surveillance.   C./c Crissie Figures, PA-C   Dr [name] MD,MRCP [facility]) Gastroenterology/Hepatology Pager: [phone]

## 2020-05-07 NOTE — Telephone Encounter (Signed)
Called and informed patient of results. Explained Dr. Georgeann Oppenheim comments/recommendations. Scheduled a telephone visit with him so patient can ask questions. Pt verbalized understanding.

## 2020-05-08 ENCOUNTER — Telehealth (INDEPENDENT_AMBULATORY_CARE_PROVIDER_SITE_OTHER): Payer: Medicare Other | Admitting: Gastroenterology

## 2020-05-08 DIAGNOSIS — Z8719 Personal history of other diseases of the digestive system: Secondary | ICD-10-CM | POA: Diagnosis not present

## 2020-05-08 DIAGNOSIS — Z86010 Personal history of colonic polyps: Secondary | ICD-10-CM

## 2020-05-08 NOTE — Progress Notes (Signed)
[name] , MD [address]  [place]  [place], [place] [address]  Main: [phone]  Fax: [phone]   Primary Care Physician: Crissie Figures, PA-C  Virtual Visit via Video Note: The patient was initially scheduled for video visit but she had difficulty connecting and hence had to be converted to telephone visit  I connected with patient on 05/08/20 at 10 AM by telephone and verified that I am speaking with the correct person using two identifiers.   I discussed the limitations, risks, security and privacy concerns of performing an evaluation and management service by video  and the availability of in person appointments. I also discussed with the patient that there may be a patient responsible charge related to this service. The patient expressed understanding and agreed to proceed.  Location of Patient: Home Location of Provider: Home Persons involved: Patient and provider only   History of Present Illness: Discussed pathology results.  HPI: Olivia Love is a 65 y.o. female was referred to me for a colonoscopy on 04/08/2020. I performed a colonoscopy on 04/25/2020 a 20 mm polyp was found in the sigmoid colon the polyp was pedunculated and removed with a hot snare. A 5 mm polyp was found in the descending colon the polyp was removed with a cold snare. Otherwise she had diverticulosis of the colon. 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 The cauterized base was free of dysplasia.  Since the procedure she is doing well and has no complaints and no further bleeding. Current Outpatient Medications  Medication Sig Dispense Refill  . ACCU-CHEK GUIDE test strip USE TO CHECK BLOOD SUGAR ONCE DAILY    . Accu-Chek Softclix Lancets lancets SMARTSIG:1 Topical Daily    . albuterol (VENTOLIN HFA) 108 (90 Base) MCG/ACT inhaler Inhale  2 puffs into the lungs every 6 (six) hours. (Patient not taking: Reported on 04/25/2020) 8 g 0  . amLODipine (NORVASC) 10 MG tablet Take 1 tablet (10 mg total) by mouth daily. (Patient not taking: No sig reported) 30 tablet 0  . BLACK ELDERBERRY PO Take by mouth daily.    . Blood Glucose Monitoring Suppl (ACCU-CHEK GUIDE ME) w/Device KIT     . cholecalciferol (VITAMIN D) 1000 units tablet Take 1,000 Units by mouth daily.    Marland Kitchen FLOVENT HFA 110 MCG/ACT inhaler Inhale 2 puffs into the lungs 2 (two) times daily. (Patient not taking: Reported on 04/25/2020)    . guaiFENesin-dextromethorphan (ROBITUSSIN DM) 100-10 MG/5ML syrup Take 10 mLs by mouth every 4 (four) hours as needed for cough. (Patient not taking: Reported on 04/25/2020) 118 mL 0  . hydrochlorothiazide (MICROZIDE) 12.5 MG capsule Take 12.5 mg by mouth daily.    . metFORMIN (GLUCOPHAGE) 500 MG tablet Take 500 mg by mouth 2 (two) times daily.    . vitamin C (ASCORBIC ACID) 250 MG tablet Take 250 mg by mouth daily.    Marland Kitchen zinc sulfate 220 (50 Zn) MG capsule Take 1 capsule (220 mg total) by mouth daily. 90 capsule 0   No current facility-administered medications for this visit.    Allergies as of 05/08/2020 - Review Complete 04/25/2020  Allergen Reaction Noted  . Morphine Itching 01/09/2019  . Morphine and related Itching 01/31/2016    Review of Systems:    All systems reviewed and negative except where noted in HPI.  General Appearance:  Alert, cooperative, no distress, appears stated age  Head:    Normocephalic, without obvious abnormality, atraumatic  Eyes:    PERRL, conjunctiva/corneas clear,  Ears:    Grossly normal hearing    Neurologic:  Grossly normal    Observations/Objective:  Labs: CMP     Component Value Date/Time   NA 136 11/10/2019 0524   K 3.8 11/10/2019 0524   CL 104 11/10/2019 0524   CO2 21 (L) 11/10/2019 0524   GLUCOSE 300 (H) 11/10/2019 0524   BUN 14 11/10/2019 0524   CREATININE 0.71 11/10/2019 0524    CALCIUM 9.0 11/10/2019 0524   PROT 7.0 11/10/2019 0524   ALBUMIN 3.5 11/10/2019 0524   AST 22 11/10/2019 0524   ALT 24 11/10/2019 0524   ALKPHOS 73 11/10/2019 0524   BILITOT 1.5 (H) 11/10/2019 0524   GFRNONAA >60 11/10/2019 0524   GFRAA >60 11/10/2019 0524   Lab Results  Component Value Date   WBC 14.4 (H) 11/10/2019   HGB 10.3 (L) 11/10/2019   HCT 32.6 (L) 11/10/2019   MCV 63.2 (L) 11/10/2019   PLT 348 11/10/2019    Imaging Studies: No results found.  Assessment and Plan:   DEVANI Love is a 65 y.o. y/o female recently underwent a colonoscopy for colon cancer screening and was found to have a tubular adenoma that was snared in the descending colon and a very large polyp in the sigmoid colon that was pedunculated and was resected. This demonstrated tubulovillous adenoma with high-grade dysplasia. The cauterized base was free of dysplasia. Explained that this was 1 step away from cancer.  Plan 1. Sigmoidoscopy in 6 months to evaluate the sigmoid colon to ensure that there is no residual polyp although the pathology report clearly has demonstrated that the base was free of dysplasia.     I discussed the assessment and treatment plan with the patient. The patient was provided an opportunity to ask questions and all were answered. The patient agreed with the plan and demonstrated an understanding of the instructions.   The patient was advised to call back or seek an in-person evaluation if the symptoms worsen or if the condition fails to improve as anticipated.  I provided 17 minutes of face-to-face time during this encounter.  Dr [name] MD,MRCP [facility]) Gastroenterology/Hepatology Pager: [phone]   Speech recognition software was used to dictate this note.

## 2021-02-18 ENCOUNTER — Telehealth: Payer: Self-pay

## 2021-02-19 NOTE — Telephone Encounter (Signed)
error 

## 2021-02-25 ENCOUNTER — Telehealth: Payer: Self-pay

## 2021-02-25 NOTE — Telephone Encounter (Signed)
CALLED PATIENT NO ANSWER LEFT VOICEMAIL FOR A CALL BACK °Letter sent °

## 2021-06-23 ENCOUNTER — Other Ambulatory Visit: Payer: Self-pay

## 2021-06-23 ENCOUNTER — Emergency Department
Admission: EM | Admit: 2021-06-23 | Discharge: 2021-06-23 | Disposition: A | Discharge status: Home / Self Care | Payer: Medicare Other | Attending: Emergency Medicine | Admitting: Emergency Medicine

## 2021-06-23 ENCOUNTER — Encounter: Payer: Self-pay | Admitting: Emergency Medicine

## 2021-06-23 ENCOUNTER — Emergency Department: Payer: Medicare Other

## 2021-06-23 DIAGNOSIS — D72829 Elevated white blood cell count, unspecified: Secondary | ICD-10-CM | POA: Diagnosis not present

## 2021-06-23 DIAGNOSIS — M5416 Radiculopathy, lumbar region: Secondary | ICD-10-CM | POA: Diagnosis not present

## 2021-06-23 DIAGNOSIS — E119 Type 2 diabetes mellitus without complications: Secondary | ICD-10-CM | POA: Diagnosis not present

## 2021-06-23 DIAGNOSIS — J40 Bronchitis, not specified as acute or chronic: Secondary | ICD-10-CM

## 2021-06-23 DIAGNOSIS — I10 Essential (primary) hypertension: Secondary | ICD-10-CM | POA: Insufficient documentation

## 2021-06-23 DIAGNOSIS — Z20822 Contact with and (suspected) exposure to covid-19: Secondary | ICD-10-CM | POA: Diagnosis not present

## 2021-06-23 DIAGNOSIS — M545 Low back pain, unspecified: Secondary | ICD-10-CM | POA: Diagnosis present

## 2021-06-23 DIAGNOSIS — R0789 Other chest pain: Secondary | ICD-10-CM | POA: Diagnosis not present

## 2021-06-23 LAB — RESP PANEL BY RT-PCR (FLU A&B, COVID) ARPGX2
Influenza A by PCR: NEGATIVE
Influenza B by PCR: NEGATIVE
SARS Coronavirus 2 by RT PCR: NEGATIVE

## 2021-06-23 LAB — CBC WITH DIFFERENTIAL/PLATELET
Abs Immature Granulocytes: 0.02 10*3/uL (ref 0.00–0.07)
Basophils Absolute: 0.1 10*3/uL (ref 0.0–0.1)
Basophils Relative: 1 %
Eosinophils Absolute: 0.1 10*3/uL (ref 0.0–0.5)
Eosinophils Relative: 1 %
HCT: 34.8 % — ABNORMAL LOW (ref 36.0–46.0)
Hemoglobin: 10.6 g/dL — ABNORMAL LOW (ref 12.0–15.0)
Immature Granulocytes: 0 %
Lymphocytes Relative: 43 %
Lymphs Abs: 3.9 10*3/uL (ref 0.7–4.0)
MCH: 19.8 pg — ABNORMAL LOW (ref 26.0–34.0)
MCHC: 30.5 g/dL (ref 30.0–36.0)
MCV: 65 fL — ABNORMAL LOW (ref 80.0–100.0)
Monocytes Absolute: 0.4 10*3/uL (ref 0.1–1.0)
Monocytes Relative: 4 %
Neutro Abs: 4.6 10*3/uL (ref 1.7–7.7)
Neutrophils Relative %: 51 %
Platelets: 320 10*3/uL (ref 150–400)
RBC: 5.35 MIL/uL — ABNORMAL HIGH (ref 3.87–5.11)
RDW: 16.6 % — ABNORMAL HIGH (ref 11.5–15.5)
Smear Review: NORMAL
WBC: 9 10*3/uL (ref 4.0–10.5)
nRBC: 0 % (ref 0.0–0.2)

## 2021-06-23 LAB — COMPREHENSIVE METABOLIC PANEL
ALT: 26 U/L (ref 0–44)
AST: 25 U/L (ref 15–41)
Albumin: 4.3 g/dL (ref 3.5–5.0)
Alkaline Phosphatase: 117 U/L (ref 38–126)
Anion gap: 10 (ref 5–15)
BUN: 9 mg/dL (ref 8–23)
CO2: 23 mmol/L (ref 22–32)
Calcium: 9.4 mg/dL (ref 8.9–10.3)
Chloride: 106 mmol/L (ref 98–111)
Creatinine, Ser: 0.75 mg/dL (ref 0.44–1.00)
GFR, Estimated: >60 mL/min (ref >60)
Glucose, Bld: 121 mg/dL — ABNORMAL HIGH (ref 70–99)
Potassium: 3.3 mmol/L — ABNORMAL LOW (ref 3.5–5.1)
Sodium: 139 mmol/L (ref 135–145)
Total Bilirubin: 1.5 mg/dL — ABNORMAL HIGH (ref 0.3–1.2)
Total Protein: 7.4 g/dL (ref 6.5–8.1)

## 2021-06-23 LAB — TROPONIN I (HIGH SENSITIVITY): Troponin I (High Sensitivity): 4 ng/L (ref <18)

## 2021-06-23 MED ORDER — GUAIFENESIN ER 600 MG PO TB12: 600.0000 mg | ORAL_TABLET | Freq: Two times a day (BID) | ORAL | 0 refills | Status: AC

## 2021-06-23 MED ORDER — ALBUTEROL SULFATE HFA 108 (90 BASE) MCG/ACT IN AERS
2.0000 | INHALATION_SPRAY | Freq: Four times a day (QID) | RESPIRATORY_TRACT | 0 refills | Status: AC | PRN
Start: 2021-06-23 — End: ?

## 2021-06-23 MED ORDER — CYCLOBENZAPRINE HCL 5 MG PO TABS: 5.0000 mg | ORAL_TABLET | Freq: Three times a day (TID) | ORAL | 0 refills | Status: AC | PRN

## 2021-06-23 MED ORDER — LIDOCAINE 5 % EX PTCH
1.0000 | MEDICATED_PATCH | CUTANEOUS | Status: DC
  Administered 2021-06-23: 1 via TRANSDERMAL
  Filled 2021-06-23: qty 1

## 2021-06-23 MED ORDER — POTASSIUM CHLORIDE CRYS ER 20 MEQ PO TBCR
20.0000 meq | EXTENDED_RELEASE_TABLET | Freq: Once | ORAL | Status: AC
  Administered 2021-06-23: 20 meq via ORAL
  Filled 2021-06-23: qty 1

## 2021-06-23 MED ORDER — LIDOCAINE 5 % EX PTCH: 1.0000 | MEDICATED_PATCH | Freq: Two times a day (BID) | CUTANEOUS | 0 refills | Status: AC

## 2021-06-23 MED ORDER — KETOROLAC TROMETHAMINE 30 MG/ML IJ SOLN
30.0000 mg | Freq: Once | INTRAMUSCULAR | Status: AC
  Administered 2021-06-23: 30 mg via INTRAMUSCULAR
  Filled 2021-06-23: qty 1

## 2021-06-23 NOTE — ED Notes (Signed)
Pt presents to ED with c/o of midsternum chest pain and pt also mentions L flank pain that pt reports "it feels like it catches when I move". Pt denies any injury or trauma to back or chest area. Pt denies N/V/D. Pt is A&Ox4.  ? ?Pt currently eating HOOTERS at bedside.  ?

## 2021-06-23 NOTE — ED Notes (Signed)
D/C and reasons to return to ED discussed with pt, pt verbalized understanding. NAD noted VSS.  ?

## 2021-06-23 NOTE — ED Triage Notes (Signed)
Pt via POV from home. Pt c/o centralized CP that radiates down her L arm, SOB, and cough for the past couple of days. Denies any cardiac hx. Denies any CHF/COPD pt does have a hx of PNA. Pt is A&Ox4 and NAD ?

## 2021-06-23 NOTE — ED Notes (Signed)
Pt at XRAY

## 2021-06-23 NOTE — ED Provider Notes (Signed)
? ?[facility] ?Provider Note ? ? ? Event Date/Time  ? First MD Initiated Contact with Patient 06/23/21 1619   ?  (approximate) ? ? ?History  ? ?Chief Complaint ?Back Pain ? ? ?HPI ? ?Olivia Love is a 66 y.o. female with past medical history of hypertension and diabetes who presents to the ED complaining of back and leg pain.  Patient reports that she has had about 5 days of pain starting in her left lower back and radiating down her left leg.  She describes the pain as constant and exacerbated by movement and certain positions, including bearing weight on her left leg.  It is a stabbing and shooting pain, but she denies any numbness or weakness in her leg, has not had any groin numbness and denies any difficulty with bowel movements or bladder function.  She denies any trauma to her back or leg.  She does additionally complain of some soreness in her chest, states she has been coughing ever since she was diagnosed with COVID 1 to 2 years ago.  She now reports for the past 5 days her chest has been sore when she coughs and she has felt slightly short of breath.  She has not had any fevers and cough is nonproductive.  She denies any pain or swelling in her legs. ?  ? ? ?Physical Exam  ? ?Triage Vital Signs: ?ED Triage Vitals  ?Enc Vitals Group  ?   BP 06/23/21 1507 (!) 139/94  ?   Pulse Rate 06/23/21 1507 (!) 102  ?   Resp 06/23/21 1507 20  ?   Temp 06/23/21 1507 98.7 ?F (37.1 ?C)  ?   Temp Source 06/23/21 1507 Oral  ?   SpO2 06/23/21 1507 94 %  ?   Weight 06/23/21 1505 235 lb (106.6 kg)  ?   Height 06/23/21 1505 '5\' 4"'$  (1.626 m)  ?   Head Circumference --   ?   Peak Flow --   ?   Pain Score 06/23/21 1505 7  ?   Pain Loc --   ?   Pain Edu? --   ?   Excl. in [place]? --   ? ? ?Most recent vital signs: ?Vitals:  ? 06/23/21 1507 06/23/21 1823  ?BP: (!) 139/94 130/88  ?Pulse: (!) 102 98  ?Resp: 20 17  ?Temp: 98.7 ?F (37.1 ?C)   ?SpO2: 94% 95%  ? ? ?Constitutional: Alert and oriented. ?Eyes:  Conjunctivae are normal. ?Head: Atraumatic. ?Nose: No congestion/rhinnorhea. ?Mouth/Throat: Mucous membranes are moist.  ?Cardiovascular: Normal rate, regular rhythm. Grossly normal heart sounds.  2+ DP pulses bilaterally. ?Respiratory: Normal respiratory effort.  No retractions. Lungs CTAB.  No chest wall tenderness to palpation. ?Gastrointestinal: Soft and nontender. No distention. ?Musculoskeletal: No lower extremity tenderness nor edema.  Tenderness to palpation over midline lumbar spine as well as left lateral paraspinal area. ?Neurologic:  Normal speech and language. No gross focal neurologic deficits are appreciated. ? ? ? ?ED Results / Procedures / Treatments  ? ?Labs ?(all labs ordered are listed, but only abnormal results are displayed) ?Labs Reviewed  ?CBC WITH DIFFERENTIAL/PLATELET - Abnormal; Notable for the following components:  ?    Result Value  ? RBC 5.35 (*)   ? Hemoglobin 10.6 (*)   ? HCT 34.8 (*)   ? MCV 65.0 (*)   ? MCH 19.8 (*)   ? RDW 16.6 (*)   ? All other components within normal limits  ?COMPREHENSIVE METABOLIC PANEL -  Abnormal; Notable for the following components:  ? Potassium 3.3 (*)   ? Glucose, Bld 121 (*)   ? Total Bilirubin 1.5 (*)   ? All other components within normal limits  ?RESP PANEL BY RT-PCR (FLU A&B, COVID) [id number]  ?TROPONIN I (HIGH SENSITIVITY)  ? ? ? ?EKG ? ?ED ECG REPORT ?Tempie Hoist, the attending physician, personally viewed and interpreted this ECG. ? ? Date: 06/23/2021 ? EKG Time: 15:02 ? Rate: 101 ? Rhythm: sinus tachycardia ? Axis: Normal ? Intervals:none ? ST&T Change: None ? ?RADIOLOGY ?Chest x-ray reviewed by me with no infiltrate, edema, or effusion. ? ?PROCEDURES: ? ?Critical Care performed: No ? ?Procedures ? ? ?MEDICATIONS ORDERED IN ED: ?Medications  ?lidocaine (LIDODERM) 5 % 1 patch (1 patch Transdermal Patch Applied 06/23/21 1804)  ?ketorolac (TORADOL) 30 MG/ML injection 30 mg (30 mg Intramuscular Given 06/23/21 1804)  ?potassium chloride SA (KLOR-CON  M) CR tablet 20 mEq (20 mEq Oral Given 06/23/21 1816)  ? ? ? ?IMPRESSION / MDM / ASSESSMENT AND PLAN / ED COURSE  ?I reviewed the triage vital signs and the nursing notes. ?             ?               ? ?66 y.o. female with past medical history of hypertension and diabetes who presents to the ED with 5 days of pain starting in her left lower back and radiating down her left leg worse with certain positions.  Patient additionally complains of soreness in her chest while coughing along with mild difficulty breathing over the past 5 days. ? ?Differential diagnosis includes, but is not limited to, ACS, PE, pneumonia, bronchitis, DVT, muscle strain, lumbar radiculopathy, cauda equina. ? ?Patient is nontoxic-appearing and in no acute distress, vital signs are reassuring and she is neurovascular intact to her bilateral lower extremities.  Patient initially checked in for chest pain and shortness of breath, now states that her primary complaint is back pain radiating down her left leg.  This is clinically consistent with a lumbar radiculopathy, no findings to suggest cauda equina or DVT.  We will treat symptomatically with IM Toradol and Lidoderm patch, suspected sciatica would be appropriate for outpatient follow-up with her PCP.  As far as her chest pain, this sounds consistent with bronchitis.  EKG shows no evidence of arrhythmia or ischemia and troponin is negative, no indication for repeat given 5 days of constant symptoms.  CBC and BMP are unremarkable with no anemia, leukocytosis, or electrolyte abnormality.  Will prescribe Mucinex and albuterol for symptomatic management and she was also counseled to follow-up with her PCP for this.  Patient counseled to return to the ED for new or worsening symptoms, patient agrees with plan. ? ?  ? ? ?FINAL CLINICAL IMPRESSION(S) / ED DIAGNOSES  ? ?Final diagnoses:  ?Lumbar radiculopathy  ?Atypical chest pain  ?Bronchitis  ? ? ? ?Rx / DC Orders  ? ?ED Discharge Orders   ? ?       Ordered  ?  albuterol (VENTOLIN HFA) 108 (90 Base) MCG/ACT inhaler  Every 6 hours PRN       ?Note to Pharmacy: Please supply with spacer  ? 06/23/21 1825  ?  guaiFENesin (MUCINEX) 600 MG 12 hr tablet  2 times daily       ? 06/23/21 1825  ?  lidocaine (LIDODERM) 5 %  Every 12 hours       ? 06/23/21 1825  ?  cyclobenzaprine (FLEXERIL) 5 MG tablet  3 times daily PRN       ? 06/23/21 1825  ? ?  ?  ? ?  ? ? ? ?Note:  This document was prepared using Dragon voice recognition software and may include unintentional dictation errors. ?  ?Blake Divine, MD ?06/23/21 1825 ? ?

## 2022-01-08 ENCOUNTER — Other Ambulatory Visit: Payer: Self-pay | Admitting: Physician Assistant

## 2022-01-08 DIAGNOSIS — Z1231 Encounter for screening mammogram for malignant neoplasm of breast: Secondary | ICD-10-CM

## 2022-01-09 ENCOUNTER — Telehealth: Payer: Self-pay

## 2022-01-09 ENCOUNTER — Other Ambulatory Visit: Payer: Self-pay

## 2022-01-09 DIAGNOSIS — Z86010 Personal history of colonic polyps: Secondary | ICD-10-CM

## 2022-01-09 MED ORDER — NA SULFATE-K SULFATE-MG SULF 17.5-3.13-1.6 GM/177ML PO SOLN: 1.0000 | Freq: Once | ORAL | 0 refills | Status: AC

## 2022-01-09 NOTE — Telephone Encounter (Signed)
Gastroenterology Pre-Procedure Review  Request Date: 02/11/22 Requesting Physician: Dr. Vicente Males  PATIENT REVIEW QUESTIONS: The patient responded to the following health history questions as indicated:    1. Are you having any GI issues? Acid reflux started taking omeprazole 2. Do you have a personal history of Polyps? yes (04/25/20 colonoscopy performed by Dr. Vicente Males noted polyps) 3. Do you have a family history of Colon Cancer or Polyps? yes (mother and sister colon polyps) 4. Diabetes Mellitus? no 5. Joint replacements in the past 12 months?no 6. Major health problems in the past 3 months?no 7. Any artificial heart valves, MVP, or defibrillator?no    MEDICATIONS & ALLERGIES:    Patient reports the following regarding taking any anticoagulation/antiplatelet therapy:   Plavix, Coumadin, Eliquis, Xarelto, Lovenox, Pradaxa, Brilinta, or Effient? no Aspirin? no  Patient confirms/reports the following medications:  Current Outpatient Medications  Medication Sig Dispense Refill   ACCU-CHEK GUIDE test strip USE TO CHECK BLOOD SUGAR ONCE DAILY     Accu-Chek Softclix Lancets lancets SMARTSIG:1 Topical Daily     albuterol (VENTOLIN HFA) 108 (90 Base) MCG/ACT inhaler Inhale 2 puffs into the lungs every 6 (six) hours as needed for wheezing or shortness of breath. 8 g 0   amLODipine (NORVASC) 10 MG tablet Take 1 tablet (10 mg total) by mouth daily. (Patient not taking: No sig reported) 30 tablet 0   BLACK ELDERBERRY PO Take by mouth daily.     Blood Glucose Monitoring Suppl (ACCU-CHEK GUIDE ME) w/Device KIT      cholecalciferol (VITAMIN D) 1000 units tablet Take 1,000 Units by mouth daily.     cyclobenzaprine (FLEXERIL) 5 MG tablet Take 1 tablet (5 mg total) by mouth 3 (three) times daily as needed. 12 tablet 0   FLOVENT HFA 110 MCG/ACT inhaler Inhale 2 puffs into the lungs 2 (two) times daily. (Patient not taking: Reported on 04/25/2020)     guaiFENesin-dextromethorphan (ROBITUSSIN DM) 100-10 MG/5ML  syrup Take 10 mLs by mouth every 4 (four) hours as needed for cough. (Patient not taking: Reported on 04/25/2020) 118 mL 0   hydrochlorothiazide (MICROZIDE) 12.5 MG capsule Take 12.5 mg by mouth daily.     lidocaine (LIDODERM) 5 % Place 1 patch onto the skin every 12 (twelve) hours. Remove & Discard patch within 12 hours or as directed by MD 10 patch 0   metFORMIN (GLUCOPHAGE) 500 MG tablet Take 500 mg by mouth 2 (two) times daily.     vitamin C (ASCORBIC ACID) 250 MG tablet Take 250 mg by mouth daily.     zinc sulfate 220 (50 Zn) MG capsule Take 1 capsule (220 mg total) by mouth daily. 90 capsule 0   No current facility-administered medications for this visit.    Patient confirms/reports the following allergies:  Allergies  Allergen Reactions   Morphine Itching   Morphine And Related Itching    No orders of the defined types were placed in this encounter.   AUTHORIZATION INFORMATION Primary Insurance: 1D#: Group #:  Secondary Insurance: 1D#: Group #:  SCHEDULE INFORMATION: Date: 02/11/2022 Time: Location: [facility]

## 2022-01-11 IMAGING — CR DG CHEST 2V
1 series · 2 of 2 positions shown · non-contrast
Comparison: None

CLINICAL DATA: Chest pain, shortness of breath

EXAM:
CHEST - 2 VIEW

[Series 1: dg chest 2 view · 0.14mm/px · 2 of 2 slices shown]
[im 1/2]
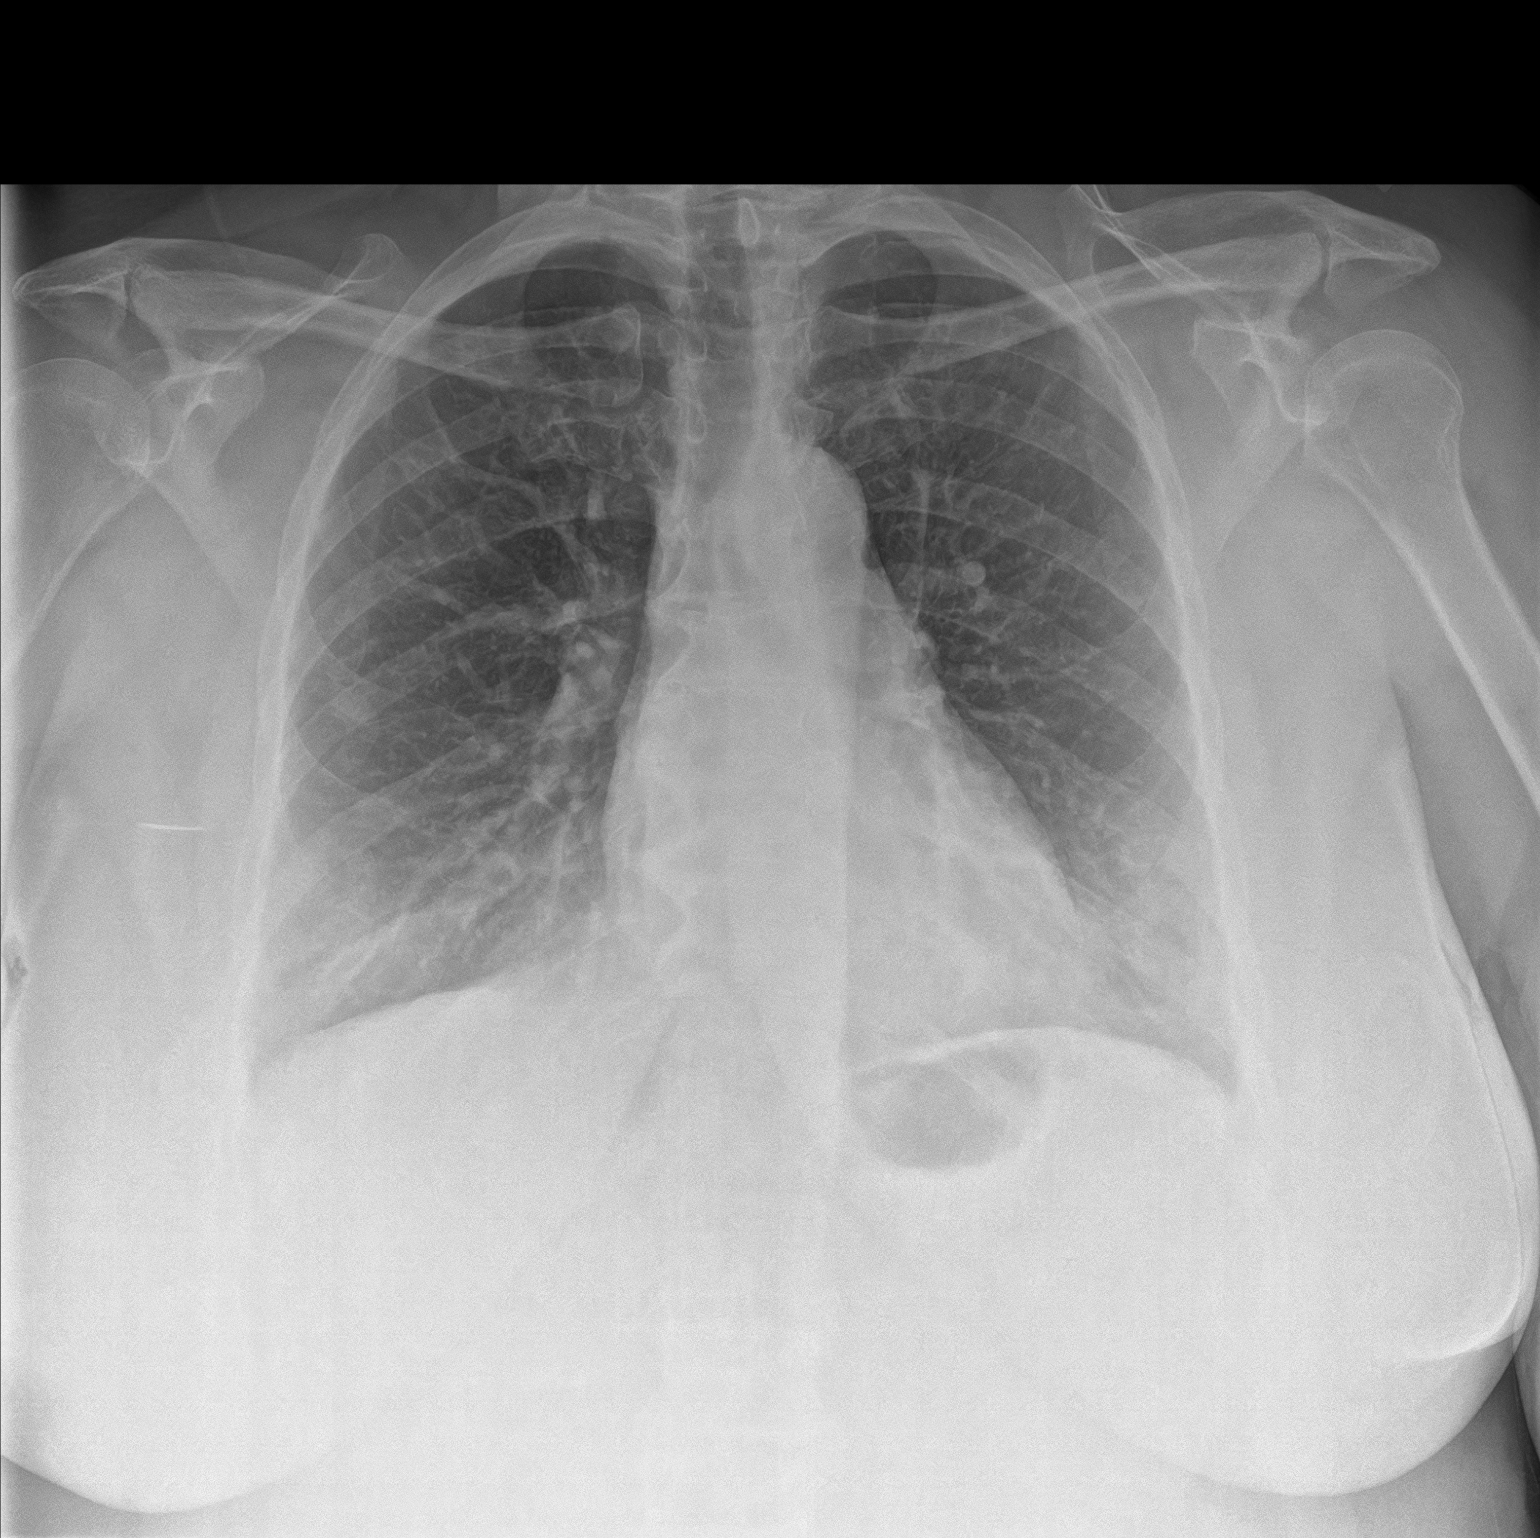
[im 2/2]
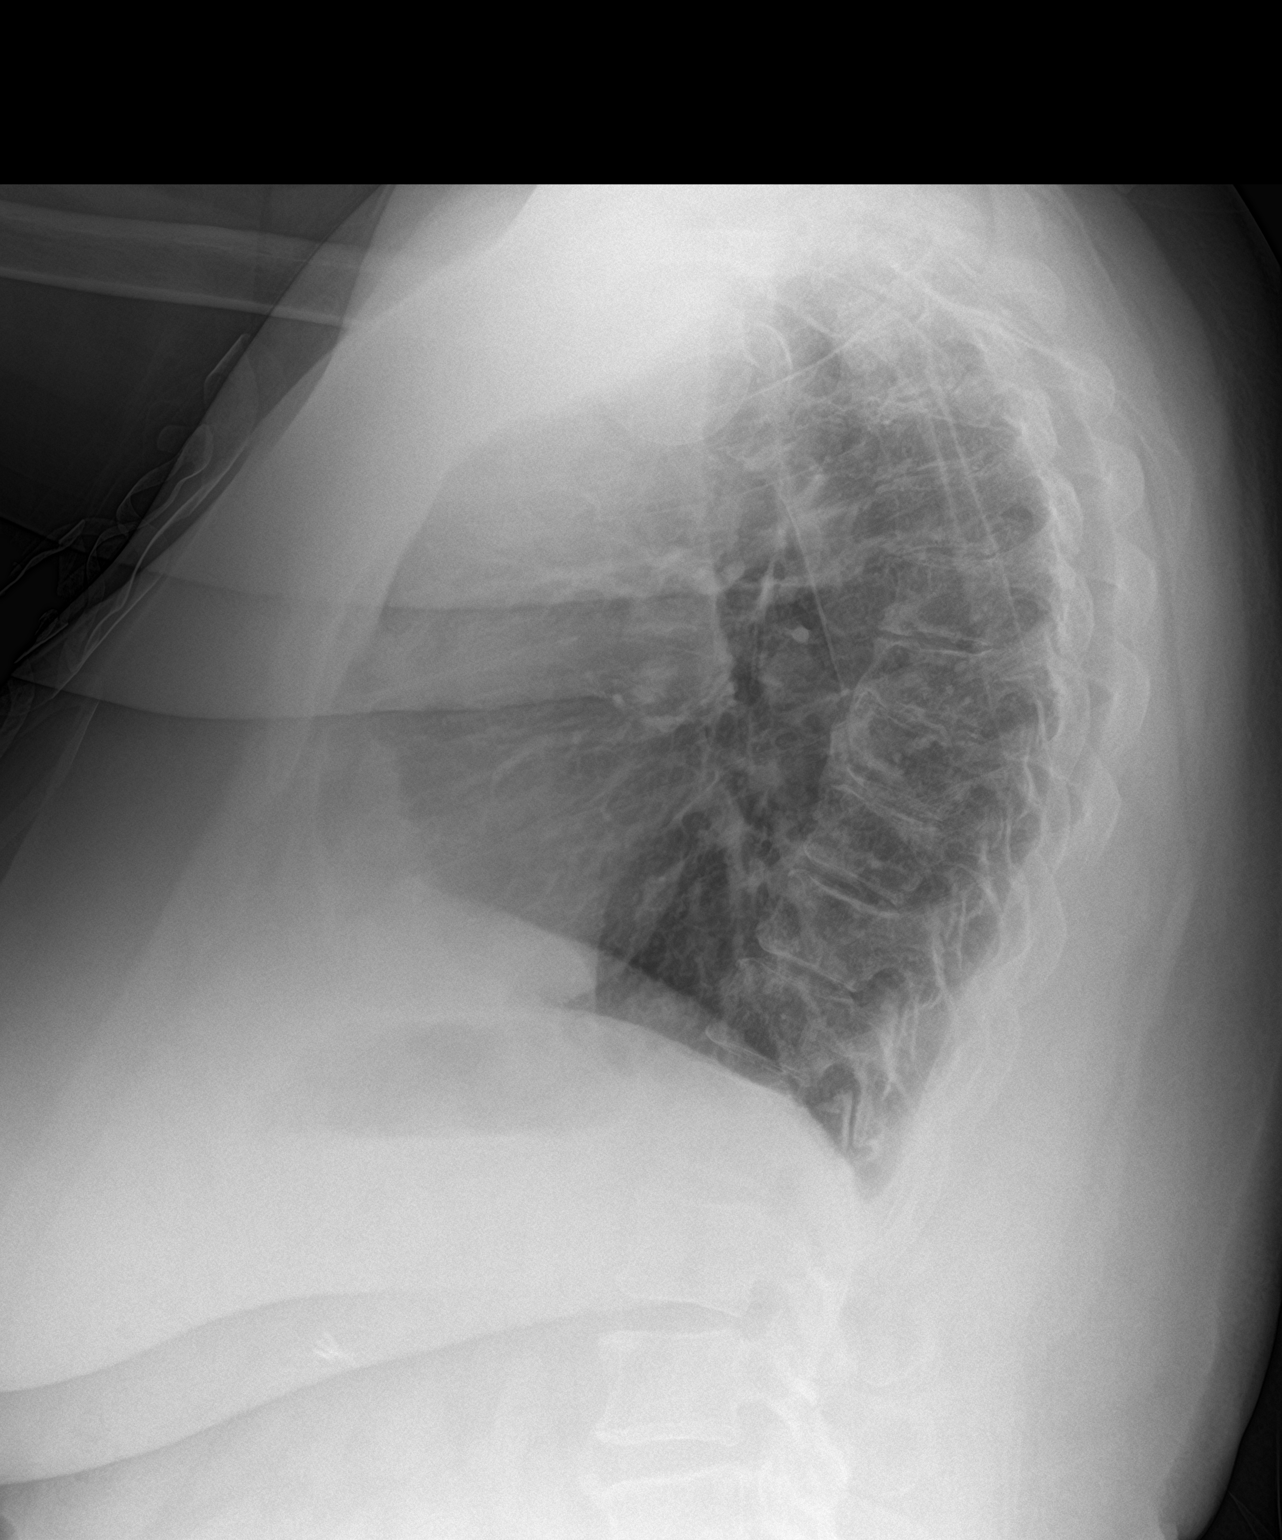

[2 of 2 positions shown; findings below may reference images not displayed]

FINDINGS: Linear radiodensity projecting over the right breast may be external
to the patient/draping. Accounting for body habitus, the lungs are
clear. No consolidation, features of edema, pneumothorax, or
effusion. Pulmonary vascularity is normally distributed. The
cardiomediastinal contours are unremarkable. No acute osseous or
soft tissue abnormality. Degenerative changes are present in the
imaged spine and shoulders. Cholecystectomy clips project over the
right upper quadrant.
IMPRESSION: 1. No acute cardiopulmonary abnormality.
2. Linear radiodensity projecting over the right breast may be
external to the patient/draping.

## 2022-02-04 ENCOUNTER — Ambulatory Visit
Admission: RE | Admit: 2022-02-04 | Discharge: 2022-02-04 | Disposition: A | Discharge status: Home / Self Care | Payer: Medicare Other | Source: Ambulatory Visit | Attending: Physician Assistant | Admitting: Physician Assistant

## 2022-02-04 DIAGNOSIS — Z1231 Encounter for screening mammogram for malignant neoplasm of breast: Secondary | ICD-10-CM | POA: Insufficient documentation

## 2022-02-06 ENCOUNTER — Encounter: Payer: Self-pay | Admitting: Physician Assistant

## 2022-02-11 ENCOUNTER — Ambulatory Visit: Payer: Medicare Other | Admitting: Anesthesiology

## 2022-02-11 ENCOUNTER — Encounter
Admission: RE | Disposition: A | Discharge status: Home / Self Care | Payer: Self-pay | Source: Home / Self Care | Attending: Gastroenterology

## 2022-02-11 ENCOUNTER — Encounter: Payer: Self-pay | Admitting: Gastroenterology

## 2022-02-11 ENCOUNTER — Other Ambulatory Visit: Payer: Self-pay

## 2022-02-11 ENCOUNTER — Ambulatory Visit
Admission: RE | Admit: 2022-02-11 | Discharge: 2022-02-11 | Disposition: A | Discharge status: Home / Self Care | Payer: Medicare Other | Attending: Gastroenterology | Admitting: Gastroenterology

## 2022-02-11 DIAGNOSIS — Z87891 Personal history of nicotine dependence: Secondary | ICD-10-CM | POA: Insufficient documentation

## 2022-02-11 DIAGNOSIS — Z860100 Personal history of colon polyps, unspecified: Secondary | ICD-10-CM

## 2022-02-11 DIAGNOSIS — E119 Type 2 diabetes mellitus without complications: Secondary | ICD-10-CM | POA: Insufficient documentation

## 2022-02-11 DIAGNOSIS — I10 Essential (primary) hypertension: Secondary | ICD-10-CM | POA: Diagnosis not present

## 2022-02-11 DIAGNOSIS — D126 Benign neoplasm of colon, unspecified: Secondary | ICD-10-CM | POA: Diagnosis not present

## 2022-02-11 DIAGNOSIS — D122 Benign neoplasm of ascending colon: Secondary | ICD-10-CM | POA: Insufficient documentation

## 2022-02-11 DIAGNOSIS — K635 Polyp of colon: Secondary | ICD-10-CM | POA: Diagnosis not present

## 2022-02-11 DIAGNOSIS — Z09 Encounter for follow-up examination after completed treatment for conditions other than malignant neoplasm: Secondary | ICD-10-CM | POA: Diagnosis present

## 2022-02-11 HISTORY — DX: COVID-19: U07.1

## 2022-02-11 HISTORY — DX: Type 2 diabetes mellitus without complications: E11.9

## 2022-02-11 HISTORY — PX: COLONOSCOPY WITH PROPOFOL: SHX5780

## 2022-02-11 LAB — GLUCOSE, CAPILLARY: Glucose-Capillary: 227 mg/dL — ABNORMAL HIGH (ref 70–99)

## 2022-02-11 SURGERY — COLONOSCOPY WITH PROPOFOL
Anesthesia: General

## 2022-02-11 MED ORDER — MIDAZOLAM HCL 2 MG/2ML IJ SOLN
INTRAMUSCULAR | Status: AC
  Filled 2022-02-11: qty 2

## 2022-02-11 MED ORDER — SODIUM CHLORIDE 0.9 % IV SOLN: INTRAVENOUS | Status: DC

## 2022-02-11 MED ORDER — SIMETHICONE 40 MG/0.6ML PO SUSP
ORAL | Status: DC | PRN
  Administered 2022-02-11: 150 mL

## 2022-02-11 MED ORDER — PROPOFOL 1000 MG/100ML IV EMUL
INTRAVENOUS | Status: AC
  Filled 2022-02-11: qty 100

## 2022-02-11 MED ORDER — PROPOFOL 10 MG/ML IV BOLUS
INTRAVENOUS | Status: DC | PRN
  Administered 2022-02-11: 50 mg via INTRAVENOUS

## 2022-02-11 MED ORDER — LIDOCAINE HCL (CARDIAC) PF 100 MG/5ML IV SOSY
PREFILLED_SYRINGE | INTRAVENOUS | Status: DC | PRN
  Administered 2022-02-11: 50 mg via INTRAVENOUS

## 2022-02-11 MED ORDER — LIDOCAINE HCL (PF) 2 % IJ SOLN
INTRAMUSCULAR | Status: AC
  Filled 2022-02-11: qty 5

## 2022-02-11 MED ORDER — PROPOFOL 500 MG/50ML IV EMUL
INTRAVENOUS | Status: DC | PRN
  Administered 2022-02-11: 75 ug/kg/min via INTRAVENOUS

## 2022-02-11 MED ORDER — MIDAZOLAM HCL 2 MG/2ML IJ SOLN
INTRAMUSCULAR | Status: DC | PRN
  Administered 2022-02-11: 2 mg via INTRAVENOUS

## 2022-02-11 NOTE — Op Note (Signed)
[facility] Patient Name: Olivia Love Procedure Date: 02/11/2022 10:51 AM MRN: [id number] Account #: [id number] Date of Birth: 1955/03/31 Admit Type: Outpatient Age: 66 Room: [facility] ENDO ROOM 3 Gender: Female Note Status: Finalized Instrument Name: Colonoscope [id number] Procedure:             Colonoscopy Indications:           High risk colon cancer surveillance: Personal history                         of adenoma with high grade dysplasia, Last                         colonoscopy: January 2022 Providers:             [name] MD, MD Referring MD:          Crissie Figures Medicines:             Monitored Anesthesia Care Complications:         No immediate complications. Procedure:             Pre-Anesthesia Assessment:                        - Prior to the procedure, a History and Physical was                         performed, and patient medications, allergies and                         sensitivities were reviewed. The patient's tolerance                         of previous anesthesia was reviewed.                        - The risks and benefits of the procedure and the                         sedation options and risks were discussed with the                         patient. All questions were answered and informed                         consent was obtained.                        - ASA Grade Assessment: II - A patient with mild                         systemic disease.                        After obtaining informed consent, the colonoscope was                         passed under direct vision. Throughout the procedure,                         the patient's blood pressure, pulse, and  oxygen                         saturations were monitored continuously. The                         Colonoscope was introduced through the anus and                         advanced to the the cecum, identified by the                         appendiceal  orifice. The colonoscopy was performed                         with ease. The patient tolerated the procedure well.                         The quality of the bowel preparation was excellent.                         The appendiceal orifice was photographed. Findings:      The perianal and digital rectal examinations were normal.      Two sessile polyps were found in the ascending colon. The polyps were 5       to 6 mm in size. These polyps were removed with a cold snare. Resection       and retrieval were complete.      Two sessile polyps were found in the sigmoid colon. The polyps were 4 to       5 mm in size. These polyps were removed with a cold snare. Resection and       retrieval were complete.      The exam was otherwise without abnormality on direct and retroflexion       views.      The entire examined colon appeared normal. Impression:            - Two 5 to 6 mm polyps in the ascending colon, removed                         with a cold snare. Resected and retrieved.                        - Two 4 to 5 mm polyps in the sigmoid colon, removed                         with a cold snare. Resected and retrieved.                        - The examination was otherwise normal on direct and                         retroflexion views.                        - The entire examined colon is normal. Recommendation:        - Discharge patient to home (with escort).                        -  Resume previous diet.                        - Continue present medications.                        - Await pathology results.                        - Repeat colonoscopy in 3 years for surveillance. Procedure Code(s):     --- Professional ---                        [phone], Colonoscopy, flexible; with removal of                         tumor(s), polyp(s), or other lesion(s) by snare                         technique Diagnosis Code(s):     --- Professional ---                        Z86.010, Personal history  of colonic polyps                        D12.2, Benign neoplasm of ascending colon                        D12.5, Benign neoplasm of sigmoid colon CPT copyright 2022 American Medical Association. All rights reserved. The codes documented in this report are preliminary and upon coder review may  be revised to meet current compliance requirements. [name], MD [name] MD, MD 02/11/2022 11:18:33 AM This report has been signed electronically. Number of Addenda: 0 Note Initiated On: 02/11/2022 10:51 AM Scope Withdrawal Time: 0 hours 11 minutes 19 seconds  Total Procedure Duration: 0 hours 14 minutes 16 seconds  Estimated Blood Loss:  Estimated blood loss: none.      [facility]

## 2022-02-11 NOTE — Anesthesia Postprocedure Evaluation (Signed)
Anesthesia Post Note  Patient: Olivia Love  Procedure(s) Performed: COLONOSCOPY WITH PROPOFOL  Patient location during evaluation: PACU Anesthesia Type: General Level of consciousness: awake and alert Pain management: pain level controlled Vital Signs Assessment: post-procedure vital signs reviewed and stable Respiratory status: spontaneous breathing, nonlabored ventilation and respiratory function stable Cardiovascular status: blood pressure returned to baseline and stable Postop Assessment: no apparent nausea or vomiting Anesthetic complications: no   No notable events documented.   Last Vitals:  Vitals:   02/11/22 1130 02/11/22 1140  BP: 103/82 120/83  Pulse: (!) 105 99  Resp: 15 19  Temp:    SpO2: 100% 100%    Last Pain:  Vitals:   02/11/22 1140  TempSrc:   PainSc: 0-No pain                 [place] Ouch

## 2022-02-11 NOTE — Transfer of Care (Signed)
Immediate Anesthesia Transfer of Care Note  Patient: RITHA SAMPEDRO  Procedure(s) Performed: COLONOSCOPY WITH PROPOFOL  Patient Location: PACU  Anesthesia Type:General  Level of Consciousness: sedated  Airway & Oxygen Therapy: Patient Spontanous Breathing  Post-op Assessment: Report given to RN and Post -op Vital signs reviewed and stable  Post vital signs: Reviewed and stable  Last Vitals:  Vitals Value Taken Time  BP 106/67 02/11/22 1120  Temp    Pulse 105 02/11/22 1121  Resp 18 02/11/22 1121  SpO2 99 % 02/11/22 1121  Vitals shown include unvalidated device data.  Last Pain:  Vitals:   02/11/22 1046  TempSrc: Temporal  PainSc: 0-No pain         Complications: No notable events documented.

## 2022-02-11 NOTE — H&P (Signed)
[name], MD [address] [place], [place], [place], [address] [address], [place], [place], [place], [address] Phone: [phone]  Fax: [phone]  Primary Care Physician:  Crissie Figures, PA-C   Pre-Procedure History & Physical: HPI:  Olivia Love is a 66 y.o. female is here for an colonoscopy.   Past Medical History:  Diagnosis Date   COVID-19    2021   Diabetes mellitus without complication ([place])    Hypertension     Past Surgical History:  Procedure Laterality Date   ABDOMINAL HYSTERECTOMY     CESAREAN SECTION     x2   CHOLECYSTECTOMY N/A 02/02/2016   Procedure: LAPAROSCOPIC CHOLECYSTECTOMY;  Surgeon: Dia Crawford III, MD;  Location: [facility] ORS;  Service: General;  Laterality: N/A;   COLONOSCOPY WITH PROPOFOL N/A 04/25/2020   Procedure: COLONOSCOPY WITH PROPOFOL;  Surgeon: [name], MD;  Location: [facility] ENDOSCOPY;  Service: Gastroenterology;  Laterality: N/A;   TONSILLECTOMY      Prior to Admission medications   Medication Sig Start Date End Date Taking? Authorizing Provider  BLACK ELDERBERRY PO Take by mouth daily.   Yes [provider]  cholecalciferol (VITAMIN D) 1000 units tablet Take 1,000 Units by mouth daily.   Yes [provider]  hydrochlorothiazide (MICROZIDE) 12.5 MG capsule Take 12.5 mg by mouth daily.   Yes [provider]  metFORMIN (GLUCOPHAGE) 500 MG tablet Take 500 mg by mouth 2 (two) times daily with a meal.   Yes [provider]  vitamin C (ASCORBIC ACID) 250 MG tablet Take 250 mg by mouth daily.   Yes [provider]  zinc sulfate 220 (50 Zn) MG capsule Take 1 capsule (220 mg total) by mouth daily. 11/10/19  Yes Lorella Nimrod, MD  ACCU-CHEK GUIDE test strip USE TO CHECK BLOOD SUGAR ONCE DAILY 02/20/20   [provider]  Accu-Chek Softclix Lancets lancets SMARTSIG:1 Topical Daily 02/24/20   [provider]  albuterol (VENTOLIN HFA) 108 (90 Base) MCG/ACT inhaler Inhale 2 puffs  into the lungs every 6 (six) hours as needed for wheezing or shortness of breath. 06/23/21   Blake Divine, MD  amLODipine (NORVASC) 10 MG tablet Take 1 tablet (10 mg total) by mouth daily. Patient not taking: No sig reported 11/10/19   Lorella Nimrod, MD  Blood Glucose Monitoring Suppl (ACCU-CHEK GUIDE ME) w/Device KIT  11/30/19   [provider]  cyclobenzaprine (FLEXERIL) 5 MG tablet Take 1 tablet (5 mg total) by mouth 3 (three) times daily as needed. 06/23/21   Blake Divine, MD  FLOVENT HFA 110 MCG/ACT inhaler Inhale 2 puffs into the lungs 2 (two) times daily. Patient not taking: Reported on 04/25/2020 02/05/20   [provider]  guaiFENesin-dextromethorphan (ROBITUSSIN DM) 100-10 MG/5ML syrup Take 10 mLs by mouth every 4 (four) hours as needed for cough. Patient not taking: Reported on 04/25/2020 11/10/19   Lorella Nimrod, MD  lidocaine (LIDODERM) 5 % Place 1 patch onto the skin every 12 (twelve) hours. Remove & Discard patch within 12 hours or as directed by MD 06/23/21 06/23/22  Blake Divine, MD    Allergies as of 01/09/2022 - Review Complete 06/23/2021  Allergen Reaction Noted   Morphine Itching 01/09/2019   Morphine and related Itching 01/31/2016    Family History  Problem Relation Age of Onset   Breast cancer Cousin        mat cousin    Social History   Socioeconomic History   Marital status: Single    Spouse  name: Not on file   Number of children: Not on file   Years of education: Not on file   Highest education level: Not on file  Occupational History   Not on file  Tobacco Use   Smoking status: Former    Types: Cigarettes   Smokeless tobacco: Never  Vaping Use   Vaping Use: Never used  Substance and Sexual Activity   Alcohol use: Yes    Comment: occasional   Drug use: No   Sexual activity: Not on file  Other Topics Concern   Not on file  Social History Narrative   Not on file   Social Determinants of Health   Financial Resource Strain: Not  on file  Food Insecurity: Not on file  Transportation Needs: Not on file  Physical Activity: Not on file  Stress: Not on file  Social Connections: Not on file  Intimate Partner Violence: Not on file    Review of Systems: See HPI, otherwise negative ROS  Physical Exam: BP 112/87   Pulse (!) 102   Temp (!) 97.1 F (36.2 C) (Temporal)   Resp 20   Ht 5' 4" (1.626 m)   Wt 103.4 kg   SpO2 98%   BMI 39.14 kg/m  General:   Alert,  pleasant and cooperative in NAD Head:  Normocephalic and atraumatic. Neck:  Supple; no masses or thyromegaly. Lungs:  Clear throughout to auscultation, normal respiratory effort.    Heart:  +S1, +S2, Regular rate and rhythm, No edema. Abdomen:  Soft, nontender and nondistended. Normal bowel sounds, without guarding, and without rebound.   Neurologic:  Alert and  oriented x4;  grossly normal neurologically.  Impression/Plan: Olivia Love is here for an colonoscopy to be performed for surveillance due to prior history of colon polyps   Risks, benefits, limitations, and alternatives regarding  colonoscopy have been reviewed with the patient.  Questions have been answered.  All parties agreeable.   [name], MD  02/11/2022, 10:53 AM

## 2022-02-11 NOTE — Anesthesia Preprocedure Evaluation (Addendum)
Anesthesia Evaluation  Patient identified by MRN, date of birth, ID band Patient awake    Reviewed: Allergy & Precautions, NPO status , Patient's Chart, lab work & pertinent test results  Airway Mallampati: III  TM Distance: >3 FB Neck ROM: full    Dental  (+) Partial Lower   Pulmonary former smoker   Pulmonary exam normal        Cardiovascular Exercise Tolerance: Poor hypertension, + DOE   Rhythm:Regular Rate:Tachycardia - Systolic murmurs and - Peripheral Edema    Neuro/Psych negative neurological ROS  negative psych ROS   GI/Hepatic negative GI ROS, Neg liver ROS,,,  Endo/Other  diabetes    Renal/GU negative Renal ROS  negative genitourinary   Musculoskeletal   Abdominal  (+) + obese  Peds  Hematology negative hematology ROS (+)   Anesthesia Other Findings Past Medical History: No date: Hypertension  Past Surgical History: No date: ABDOMINAL HYSTERECTOMY No date: CESAREAN SECTION 02/02/2016: CHOLECYSTECTOMY; N/A     Comment:  Procedure: LAPAROSCOPIC CHOLECYSTECTOMY;  Surgeon: Dia Crawford III, MD;  Location: [facility] ORS;  Service: General;                Laterality: N/A; 04/25/2020: COLONOSCOPY WITH PROPOFOL; N/A     Comment:  Procedure: COLONOSCOPY WITH PROPOFOL;  Surgeon: [name], MD;  Location: [facility] ENDOSCOPY;  Service:               Gastroenterology;  Laterality: N/A; No date: TONSILLECTOMY     Reproductive/Obstetrics negative OB ROS                             Anesthesia Physical Anesthesia Plan  ASA: 3  Anesthesia Plan: General   Post-op Pain Management:    Induction:   PONV Risk Score and Plan: Propofol infusion and TIVA  Airway Management Planned: Natural Airway  Additional Equipment:   Intra-op Plan:   Post-operative Plan:   Informed Consent: I have reviewed the patients History and Physical, chart, labs and discussed the  procedure including the risks, benefits and alternatives for the proposed anesthesia with the patient or authorized representative who has indicated his/her understanding and acceptance.     Dental Advisory Given  Plan Discussed with: Anesthesiologist, CRNA and Surgeon  Anesthesia Plan Comments:        Anesthesia Quick Evaluation

## 2022-02-12 ENCOUNTER — Encounter: Payer: Self-pay | Admitting: Gastroenterology

## 2022-02-12 LAB — SURGICAL PATHOLOGY

## 2022-02-16 ENCOUNTER — Other Ambulatory Visit: Payer: Self-pay | Admitting: Nurse Practitioner

## 2022-02-16 DIAGNOSIS — R928 Other abnormal and inconclusive findings on diagnostic imaging of breast: Secondary | ICD-10-CM

## 2022-02-16 DIAGNOSIS — N63 Unspecified lump in unspecified breast: Secondary | ICD-10-CM

## 2022-03-06 ENCOUNTER — Ambulatory Visit
Admission: RE | Admit: 2022-03-06 | Discharge: 2022-03-06 | Disposition: A | Discharge status: Home / Self Care | Payer: Medicare Other | Source: Ambulatory Visit | Attending: Nurse Practitioner | Admitting: Nurse Practitioner

## 2022-03-06 DIAGNOSIS — R928 Other abnormal and inconclusive findings on diagnostic imaging of breast: Secondary | ICD-10-CM | POA: Diagnosis present

## 2022-03-06 DIAGNOSIS — N63 Unspecified lump in unspecified breast: Secondary | ICD-10-CM

## 2022-03-20 ENCOUNTER — Other Ambulatory Visit: Payer: Self-pay | Admitting: Nurse Practitioner

## 2022-03-20 DIAGNOSIS — N63 Unspecified lump in unspecified breast: Secondary | ICD-10-CM

## 2022-03-20 DIAGNOSIS — R928 Other abnormal and inconclusive findings on diagnostic imaging of breast: Secondary | ICD-10-CM

## 2022-04-03 ENCOUNTER — Ambulatory Visit
Admission: RE | Admit: 2022-04-03 | Discharge: 2022-04-03 | Disposition: A | Discharge status: Home / Self Care | Payer: Medicare Other | Source: Ambulatory Visit | Attending: Nurse Practitioner | Admitting: Nurse Practitioner

## 2022-04-03 DIAGNOSIS — N63 Unspecified lump in unspecified breast: Secondary | ICD-10-CM | POA: Diagnosis present

## 2022-04-03 DIAGNOSIS — R928 Other abnormal and inconclusive findings on diagnostic imaging of breast: Secondary | ICD-10-CM | POA: Diagnosis not present

## 2022-04-03 HISTORY — PX: BREAST BIOPSY: SHX20

## 2022-04-03 MED ORDER — LIDOCAINE HCL (PF) 1 % IJ SOLN
5.0000 mL | Freq: Once | INTRAMUSCULAR | Status: AC
  Administered 2022-04-03: 5 mL via INTRADERMAL

## 2022-04-06 LAB — SURGICAL PATHOLOGY

## 2022-04-29 ENCOUNTER — Other Ambulatory Visit: Payer: Self-pay | Admitting: Nurse Practitioner

## 2022-04-29 DIAGNOSIS — Z780 Asymptomatic menopausal state: Secondary | ICD-10-CM

## 2022-08-04 ENCOUNTER — Other Ambulatory Visit: Payer: Medicare Other

## 2023-08-18 ENCOUNTER — Other Ambulatory Visit: Payer: Self-pay | Admitting: Nurse Practitioner

## 2023-08-18 DIAGNOSIS — Z1231 Encounter for screening mammogram for malignant neoplasm of breast: Secondary | ICD-10-CM

## 2023-11-08 ENCOUNTER — Ambulatory Visit
Admission: RE | Admit: 2023-11-08 | Discharge: 2023-11-08 | Disposition: A | Discharge status: Home / Self Care | Source: Ambulatory Visit | Attending: Nurse Practitioner | Admitting: Nurse Practitioner

## 2023-11-08 DIAGNOSIS — Z1231 Encounter for screening mammogram for malignant neoplasm of breast: Secondary | ICD-10-CM | POA: Insufficient documentation

## 2023-11-23 ENCOUNTER — Other Ambulatory Visit: Payer: Self-pay | Admitting: Nurse Practitioner

## 2023-11-23 DIAGNOSIS — Z780 Asymptomatic menopausal state: Secondary | ICD-10-CM

## 2023-12-22 ENCOUNTER — Ambulatory Visit
Admission: RE | Admit: 2023-12-22 | Discharge: 2023-12-22 | Disposition: A | Discharge status: Home / Self Care | Source: Ambulatory Visit | Attending: Nurse Practitioner | Admitting: Nurse Practitioner

## 2023-12-22 DIAGNOSIS — Z780 Asymptomatic menopausal state: Secondary | ICD-10-CM | POA: Insufficient documentation

## 2024-03-08 ENCOUNTER — Ambulatory Visit: Admitting: Podiatry

## 2024-03-17 ENCOUNTER — Ambulatory Visit: Admitting: Podiatry

## 2024-03-17 ENCOUNTER — Encounter: Payer: Self-pay | Admitting: Podiatry

## 2024-03-17 VITALS — Ht 64.0 in | Wt 228.0 lb

## 2024-03-17 DIAGNOSIS — B351 Tinea unguium: Secondary | ICD-10-CM

## 2024-03-17 DIAGNOSIS — M79674 Pain in right toe(s): Secondary | ICD-10-CM | POA: Diagnosis not present

## 2024-03-17 DIAGNOSIS — M79675 Pain in left toe(s): Secondary | ICD-10-CM | POA: Diagnosis not present

## 2024-03-17 NOTE — Progress Notes (Signed)
" ° °  Chief Complaint  Patient presents with   Nail Problem    Pt is here for [facility], concerns with right 1st and 5th toes due to discoloration.    SUBJECTIVE Patient with a history of diabetes mellitus presents to office today complaining of elongated, thickened nails that cause pain while ambulating in shoes.  Patient is unable to trim their own nails. Patient is here for further evaluation and treatment.  Past Medical History:  Diagnosis Date   COVID-19    2021   Diabetes mellitus without complication (HCC)    Hypertension     Allergies[1]   OBJECTIVE General Patient is awake, alert, and oriented x 3 and in no acute distress. Derm Skin is dry and supple bilateral. Negative open lesions or macerations. Remaining integument unremarkable. Nails are tender, long, thickened and dystrophic with subungual debris, consistent with onychomycosis, 1-5 bilateral. No signs of infection noted. Vasc  DP and PT pedal pulses palpable bilaterally. Temperature gradient within normal limits.  Neuro Epicritic and protective threshold sensation diminished bilaterally.  Musculoskeletal Exam No symptomatic pedal deformities noted bilateral. Muscular strength within normal limits.  ASSESSMENT 1. Diabetes Mellitus w/ peripheral neuropathy 2.  Pain due to onychomycosis of toenails bilateral  PLAN OF CARE 1. Patient evaluated today. 2. Instructed to maintain good pedal hygiene and foot care. Stressed importance of controlling blood sugar.  3. Mechanical debridement of nails 1-5 bilaterally performed using a nail nipper. Filed with dremel without incident.  4. Return to clinic in 3 mos.     Thresa EMERSON Sar, DPM [facility]  Dr. Thresa EMERSON Sar, DPM    [address] [place], [place] [address]                Office [phone]  Fax [phone]         [1]  Allergies Allergen Reactions   Morphine Itching   Morphine And Codeine Itching    "

## 2024-06-16 ENCOUNTER — Ambulatory Visit: Admitting: Podiatry
